# Patient Record
Sex: Male | Born: 1940
Health system: Southern US, Community
[De-identification: ages and names within clinical notes are randomized; demographics above are authoritative.]

## PROBLEM LIST (undated history)

## (undated) DIAGNOSIS — E039 Hypothyroidism, unspecified: Secondary | ICD-10-CM

## (undated) DIAGNOSIS — I10 Essential (primary) hypertension: Secondary | ICD-10-CM

## (undated) DIAGNOSIS — D649 Anemia, unspecified: Secondary | ICD-10-CM

## (undated) DIAGNOSIS — Z973 Presence of spectacles and contact lenses: Secondary | ICD-10-CM

## (undated) DIAGNOSIS — E785 Hyperlipidemia, unspecified: Secondary | ICD-10-CM

## (undated) DIAGNOSIS — Z87440 Personal history of urinary (tract) infections: Secondary | ICD-10-CM

## (undated) DIAGNOSIS — N343 Urethral syndrome, unspecified: Secondary | ICD-10-CM

## (undated) DIAGNOSIS — G4730 Sleep apnea, unspecified: Secondary | ICD-10-CM

## (undated) DIAGNOSIS — G4733 Obstructive sleep apnea (adult) (pediatric): Secondary | ICD-10-CM

## (undated) DIAGNOSIS — Z960 Presence of urogenital implants: Secondary | ICD-10-CM

## (undated) DIAGNOSIS — R079 Chest pain, unspecified: Secondary | ICD-10-CM

## (undated) DIAGNOSIS — R351 Nocturia: Secondary | ICD-10-CM

## (undated) DIAGNOSIS — L309 Dermatitis, unspecified: Secondary | ICD-10-CM

## (undated) DIAGNOSIS — E7800 Pure hypercholesterolemia, unspecified: Secondary | ICD-10-CM

## (undated) DIAGNOSIS — F419 Anxiety disorder, unspecified: Secondary | ICD-10-CM

## (undated) DIAGNOSIS — M1990 Unspecified osteoarthritis, unspecified site: Secondary | ICD-10-CM

## (undated) DIAGNOSIS — Z978 Presence of other specified devices: Secondary | ICD-10-CM

## (undated) DIAGNOSIS — K589 Irritable bowel syndrome without diarrhea: Secondary | ICD-10-CM

## (undated) DIAGNOSIS — H269 Unspecified cataract: Secondary | ICD-10-CM

## (undated) DIAGNOSIS — M7661 Achilles tendinitis, right leg: Secondary | ICD-10-CM

## (undated) DIAGNOSIS — N1330 Unspecified hydronephrosis: Secondary | ICD-10-CM

## (undated) HISTORY — DX: Unspecified cataract: H26.9

## (undated) HISTORY — PX: CATARACT EXTRACTION: SUR2

## (undated) HISTORY — DX: Hyperlipidemia, unspecified: E78.5

## (undated) HISTORY — PX: OTHER SURGICAL HISTORY: SHX169

## (undated) HISTORY — PX: WISDOM TOOTH EXTRACTION: SHX21

## (undated) HISTORY — PX: EYE SURGERY: SHX253

## (undated) HISTORY — DX: Obstructive sleep apnea (adult) (pediatric): G47.33

## (undated) HISTORY — PX: MASS EXCISION: SHX2000

## (undated) HISTORY — DX: Sleep apnea, unspecified: G47.30

## (undated) HISTORY — DX: Chest pain, unspecified: R07.9

## (undated) HISTORY — PX: TONSILLECTOMY: SUR1361

## (undated) HISTORY — DX: Essential (primary) hypertension: I10

## (undated) HISTORY — DX: Anxiety disorder, unspecified: F41.9

---
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
## 1997-10-19 ENCOUNTER — Ambulatory Visit (HOSPITAL_COMMUNITY): Admission: RE | Admit: 1997-10-19 | Discharge: 1997-10-19 | Payer: Self-pay | Admitting: Podiatry

## 2000-02-15 ENCOUNTER — Encounter: Payer: Self-pay | Admitting: Internal Medicine

## 2000-02-15 ENCOUNTER — Encounter: Admission: RE | Admit: 2000-02-15 | Discharge: 2000-02-15 | Payer: Self-pay | Admitting: Internal Medicine

## 2001-02-26 ENCOUNTER — Other Ambulatory Visit: Admission: RE | Admit: 2001-02-26 | Discharge: 2001-02-26 | Payer: Self-pay | Admitting: Gastroenterology

## 2001-02-26 ENCOUNTER — Encounter (INDEPENDENT_AMBULATORY_CARE_PROVIDER_SITE_OTHER): Payer: Self-pay | Admitting: Specialist

## 2004-10-12 ENCOUNTER — Ambulatory Visit (HOSPITAL_COMMUNITY): Admission: RE | Admit: 2004-10-12 | Discharge: 2004-10-12 | Payer: Self-pay | Admitting: *Deleted

## 2005-01-21 ENCOUNTER — Ambulatory Visit: Payer: Self-pay | Admitting: Gastroenterology

## 2005-02-07 ENCOUNTER — Ambulatory Visit: Payer: Self-pay | Admitting: Gastroenterology

## 2008-05-09 ENCOUNTER — Ambulatory Visit (HOSPITAL_COMMUNITY): Admission: RE | Admit: 2008-05-09 | Discharge: 2008-05-09 | Payer: Self-pay | Admitting: Ophthalmology

## 2008-08-08 DIAGNOSIS — G4733 Obstructive sleep apnea (adult) (pediatric): Secondary | ICD-10-CM

## 2008-08-08 HISTORY — DX: Obstructive sleep apnea (adult) (pediatric): G47.33

## 2010-01-03 ENCOUNTER — Encounter (INDEPENDENT_AMBULATORY_CARE_PROVIDER_SITE_OTHER): Payer: Self-pay | Admitting: *Deleted

## 2010-01-11 ENCOUNTER — Encounter: Payer: Self-pay | Admitting: Gastroenterology

## 2010-02-26 ENCOUNTER — Encounter (INDEPENDENT_AMBULATORY_CARE_PROVIDER_SITE_OTHER): Payer: Self-pay | Admitting: *Deleted

## 2010-02-28 ENCOUNTER — Ambulatory Visit: Payer: Self-pay | Admitting: Gastroenterology

## 2010-03-14 ENCOUNTER — Ambulatory Visit: Payer: Self-pay | Admitting: Gastroenterology

## 2010-07-03 NOTE — Miscellaneous (Signed)
Summary: [facility] Previsit/prep  Clinical Lists Changes  Medications: Added new medication of MOVIPREP 100 GM  SOLR (PEG-KCL-NACL-NASULF-NA ASC-C) As per prep instructions. - Signed Rx of MOVIPREP 100 GM  SOLR (PEG-KCL-NACL-NASULF-NA ASC-C) As per prep instructions.;  #1 x 0;  Signed;  Entered by: Wyona Almas RN;  Authorized by: Louis Meckel MD;  Method used: Electronically to [facility] [address], [place], [place]  [id number], Ph: [phone] or [phone], Fax: [phone] Observations: Added new observation of NKA: T (02/28/2010 13:48)    Prescriptions: MOVIPREP 100 GM  SOLR (PEG-KCL-NACL-NASULF-NA ASC-C) As per prep instructions.  #1 x 0   Entered by:   Wyona Almas RN   Authorized by:   Louis Meckel MD   Signed by:   Wyona Almas RN on 02/28/2010   Method used:   Electronically to        [facility] (retail)       [address]       [place], [place]  [id number]       Ph: [phone] or [phone]       Fax: [phone]   RxID:   [id number]

## 2010-07-03 NOTE — Letter (Signed)
Summary: [facility] Instructions  [facility]  [address] [place], [place] [address]   Phone: [phone]  Fax: [phone]       Randy Ford    1941/03/14    MRN: [id number]        Procedure Day Dorna Bloom: Wednesday 03-14-10     Arrival Time: 7:30 a.m.     Procedure Time: 8:30 a.m.     Location of Procedure:                    _x _  [facility] (4th Floor)   PREPARATION FOR COLONOSCOPY WITH MOVIPREP   Starting 5 days prior to your procedure  03-09-10  do not eat nuts, seeds, popcorn, corn, beans, peas,  salads, or any raw vegetables.  Do not take any fiber supplements (e.g. Metamucil, Citrucel, and Benefiber).  THE DAY BEFORE YOUR PROCEDURE         DATE:  03-13-10  DAY: Tuesday  1.  Drink clear liquids the entire day-NO SOLID FOOD  2.  Do not drink anything colored red or purple.  Avoid juices with pulp.  No orange juice.  3.  Drink at least 64 oz. (8 glasses) of fluid/clear liquids during the day to prevent dehydration and help the prep work efficiently.  CLEAR LIQUIDS INCLUDE: Water Jello Ice Popsicles Tea (sugar ok, no milk/cream) Powdered fruit flavored drinks Coffee (sugar ok, no milk/cream) Gatorade Juice: apple, white grape, white cranberry  Lemonade Clear bullion, consomm, broth Carbonated beverages (any kind) Strained chicken noodle soup Hard Candy                             4.  In the morning, mix first dose of MoviPrep solution:    Empty 1 Pouch A and 1 Pouch B into the disposable container    Add lukewarm drinking water to the top line of the container. Mix to dissolve    Refrigerate (mixed solution should be used within 24 hrs)  5.  Begin drinking the prep at 5:00 p.m. The MoviPrep container is divided by 4 marks.   Every 15 minutes drink the solution down to the next mark (approximately 8 oz) until the full liter is complete.   6.  Follow completed prep with 16 oz of clear liquid of your choice (Nothing red or  purple).  Continue to drink clear liquids until bedtime.  7.  Before going to bed, mix second dose of MoviPrep solution:    Empty 1 Pouch A and 1 Pouch B into the disposable container    Add lukewarm drinking water to the top line of the container. Mix to dissolve    Refrigerate  THE DAY OF YOUR PROCEDURE      DATE:  03-14-10  DAY:  Wednesday  Beginning at  3:30 a.m. (5 hours before procedure):         1. Every 15 minutes, drink the solution down to the next mark (approx 8 oz) until the full liter is complete.  2. Follow completed prep with 16 oz. of clear liquid of your choice.    3. You may drink clear liquids until  6:30 a.m. (2 HOURS BEFORE PROCEDURE).   MEDICATION INSTRUCTIONS  Unless otherwise instructed, you should take regular prescription medications with a small sip of water   as early as possible the morning of your procedure.    Additional medication instructions: Hold Iron for 5 days  prior to procedure.         OTHER INSTRUCTIONS  You will need a responsible adult at least 68 years of age to accompany you and drive you home.   This person must remain in the waiting room during your procedure.  Wear loose fitting clothing that is easily removed.  Leave jewelry and other valuables at home.  However, you may wish to bring a book to read or  an iPod/MP3 player to listen to music as you wait for your procedure to start.  Remove all body piercing jewelry and leave at home.  Total time from sign-in until discharge is approximately 2-3 hours.  You should go home directly after your procedure and rest.  You can resume normal activities the  day after your procedure.  The day of your procedure you should not:   Drive   Make legal decisions   Operate machinery   Drink alcohol   Return to work  You will receive specific instructions about eating, activities and medications before you leave.    The above instructions have been reviewed and explained  to me by   Wyona Almas RN  February 28, 2010 2:33 PM     I fully understand and can verbalize these instructions _____________________________ Date _________

## 2010-07-03 NOTE — Procedures (Signed)
Summary: Colonoscopy  Patient: Randy Ford Note: All result statuses are Final unless otherwise noted.  Tests: (1) Colonoscopy (COL)   COL Colonoscopy           DONE     [facility]     [address]     [place], [place]  [address]           COLONOSCOPY PROCEDURE REPORT           PATIENT:  Randy Ford, Randy Ford  MR#:  [id number]     BIRTHDATE:  Aug 05, 1940, 69 yrs. old  GENDER:  male           ENDOSCOPIST:  Barbette Hair. Arlyce Dice, MD     Referred by:           PROCEDURE DATE:  03/14/2010     PROCEDURE:  Diagnostic Colonoscopy     ASA CLASS:  Class II     INDICATIONS:  1) screening h/o hamartomatous polyp removed 2002;     colo 2006 normal           MEDICATIONS:   Fentanyl 50 mcg IV, Versed 6 mg IV           DESCRIPTION OF PROCEDURE:   After the risks benefits and     alternatives of the procedure were thoroughly explained, informed     consent was obtained.  Digital rectal exam was performed and     revealed no abnormalities.   The LB 180AL [id number] endoscope was     introduced through the anus and advanced to the cecum, which was     identified by the ileocecal valve, without limitations.  The     quality of the prep was excellent, using MiraLax.  The instrument     was then slowly withdrawn as the colon was fully examined.     <<PROCEDUREIMAGES>>           FINDINGS:  A normal appearing cecum, ileocecal valve, and     appendiceal orifice were identified. The ascending, hepatic     flexure, transverse, splenic flexure, descending, sigmoid colon,     and rectum appeared unremarkable (see image1, image2, image3,     image6, image7, image10, image13, image17, and image18).     Retroflexed views in the rectum revealed no abnormalities.    The     time to cecum =  3.0  minutes. The scope was then withdrawn (time     =  8.50  min) from the patient and the procedure completed.           COMPLICATIONS:  None           ENDOSCOPIC IMPRESSION:     1) Normal colon      RECOMMENDATIONS:     1) Colonoscopy 10 years           REPEAT EXAM:  In 10 year(s) for Colonoscopy.           ______________________________     Barbette Hair. Arlyce Dice, MD           CC: Renford Dills MD           n.     Rosalie DoctorBarbette Hair. Kaplan at 03/14/2010 09:13 AM           [name], [id number]  Note: An exclamation mark (!) indicates a result that was not dispersed into the flowsheet. Document Creation Date: 03/14/2010 9:14 AM _______________________________________________________________________  (1) Order result status: Final Collection or  observation date-time: 03/14/2010 09:07 Requested date-time:  Receipt date-time:  Reported date-time:  Referring Physician:   Ordering Physician: Melvia Heaps [phone]) Specimen Source:  Source: Launa Grill Order Number: [id number] Lab site:   Appended Document: Colonoscopy    Clinical Lists Changes  Observations: Added new observation of COLONNXTDUE: 03/2020 (03/14/2010 11:00)

## 2010-07-03 NOTE — Letter (Signed)
Summary: Previsit letter  [facility]  [address] [place], [place] [address]   Phone: [phone]  Fax: [phone]       01/11/2010 MRN: [id number]  [facility] [address] [place], [place]  [address]  Dear Randy Ford,  Welcome to the Gastroenterology Division at [facility].    You are scheduled to see a nurse for your pre-procedure visit on 02-28-10 at 2pm on the 3rd floor at [facility], [address]  We ask that you try to arrive at our office 15 minutes prior to your appointment time to allow for check-in.  Your nurse visit will consist of discussing your medical and surgical history, your immediate family medical history, and your medications.    Please bring a complete list of all your medications or, if you prefer, bring the medication bottles and we will list them.  We will need to be aware of both prescribed and over the counter drugs.  We will need to know exact dosage information as well.  If you are on blood thinners (Coumadin, Plavix, Aggrenox, Ticlid, etc.) please call our office today/prior to your appointment, as we need to consult with your physician about holding your medication.   Please be prepared to read and sign documents such as consent forms, a financial agreement, and acknowledgement forms.  If necessary, and with your consent, a friend or relative is welcome to sit-in on the nurse visit with you.  Please bring your insurance card so that we may make a copy of it.  If your insurance requires a referral to see a specialist, please bring your referral form from your primary care physician.  No co-pay is required for this nurse visit.     If you cannot keep your appointment, please call [phone] to cancel or reschedule prior to your appointment date.  This allows [place] the opportunity to schedule an appointment for another patient in need of care.    Thank you for choosing  Gastroenterology for your medical needs.  We  appreciate the opportunity to care for you.  Please visit [place] at our website  to learn more about our practice.                     Sincerely.                                                                                                                   The Gastroenterology Division

## 2010-07-03 NOTE — Letter (Signed)
Summary: Colonoscopy Letter  [facility]  [address] [place], [place] [address]   Phone: [phone]  Fax: [phone]      January 03, 2010 MRN: [id number]   [facility] [address] [place], [place]  [address]   Dear Mr. PERRELL,   According to your medical record, it is time for you to schedule a Colonoscopy. The American Cancer Society recommends this procedure as a method to detect early colon cancer. Patients with a family history of colon cancer, or a personal history of colon polyps or inflammatory bowel disease are at increased risk.  This letter has been generated based on the recommendations made at the time of your procedure. If you feel that in your particular situation this may no longer apply, please contact our office.  Please call our office at [phone] to schedule this appointment or to update your records at your earliest convenience.  Thank you for cooperating with [place] to provide you with the very best care possible.   Sincerely,   Barbette Hair. Arlyce Dice, M.D.  [facility]astroenterology Division [phone]

## 2010-10-16 NOTE — Op Note (Signed)
Randy Ford, EMBLETON                ACCOUNT NO.:  [id number]   MEDICAL RECORD NO.:  [id number]          PATIENT TYPE:  AMB   LOCATION:  SDS                          FACILITY:  [facility]   PHYSICIAN:  [name], M.D.   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   PROCEDURE:  1. Repair of the retinal detachment, via pars plana vitrectomy with      focal laser photocoagulation.  2. Injection of vitreous substitute dose - SF6 25% to the right eye -      25 gauge.   SURGEON:  [name]. Rankin, MD   ANESTHESIA:  Local retrobulbar with monitored anesthesia control.   INDICATIONS FOR PROCEDURE:  The patient is a 70 year old man, who has  less than 2 days onset of profound vision loss on the basis of a macular  hole retinal detachment.  The patient was attempted to reattach the  retina.  He understands the nature of the underlying disorder.  He  understands the risk of anesthesia, occurrence of death, loss of the eye  from the underlying condition as well as surgical repair including but  limited to hemorrhage, infection, scarring, need for another surgery,  change in vision, loss of vision, and progressive disease despite  intervention.  Appropriate signed consent was obtained, the patient was  taken to the operating room.  In the operating room, appropriate  monitors followed by mild sedation.  Xylocaine 2% 5 mL was injected  retrobulbar with additional 5 mL laterally in fashion of modified Darel Hong.  The right periocular region was sterilely prepped and draped in  the usual sterile fashion.  Lid speculum was applied.  A 25-gauge trocar  was placed in the inferotemporal quadrant.   The infusion was then turned  on.  Superior trocars were applied.  Core vitrectomy was then begun.  Posterior hyaloid was detached.  Peripheral vitreous was primarily  attached at the anterior to the equator.  The vitreous skirt was then  trimmed 360 degrees.  Small retinal hole was found, two of them side-by-  side spaced at the 10 o'clock and the 10:30 position.  Detachment  extended from the 7 o'clock position up to the 11:30 position temporally  and macula up.  Decision was made to make a small retinotomy posterior  to the equator to help drain the fluid.  Because of the shallow nature  of the fluid, it did not appear that external drainage would be  appropriate.  At this time, the fluid air exchange was then completed.  Fluid fluid exchange was completed first and the fluid air exchange  completed.  Retina reattached nicely.  Laser photocoagulation was placed  around the 2 retinal holes divided superiorly as well as around  retinotomy site as well as along the vitreous base  for the imbedded  attachment superiorly, temporally, and inferotemporally.  At this time,  reaccumulated fluid was aspirated on multiple occasions.  Laser  photocoagulation was confirmed to be sealing the retinotomy site as well  as retinal holes above.  A small amount of subretinal fluid was noted in  the macular region, but this was judged that with positioning would be  able to resolve.   At this time, instruments were removed from the eye; air-SF6 25%  exchange completed.  The trocars were removed superiorly.  The infusion  removed.  The intraocular pressure had been assessed and found to be  adequate.  Subconjunctival Decadron applied.  Sterile patch and Fox  shield applied.  The patient tolerated the procedure without  complication.      [name] Rankin, M.D.  Electronically Signed     GAR/MEDQ  D:  05/09/2008  T:  05/10/2008  Job:  [id number]

## 2010-12-24 ENCOUNTER — Ambulatory Visit (INDEPENDENT_AMBULATORY_CARE_PROVIDER_SITE_OTHER): Payer: Medicare Other | Admitting: Nurse Practitioner

## 2010-12-24 ENCOUNTER — Telehealth: Payer: Self-pay | Admitting: Gastroenterology

## 2010-12-24 ENCOUNTER — Other Ambulatory Visit (INDEPENDENT_AMBULATORY_CARE_PROVIDER_SITE_OTHER): Payer: Medicare Other

## 2010-12-24 ENCOUNTER — Encounter: Payer: Self-pay | Admitting: Nurse Practitioner

## 2010-12-24 DIAGNOSIS — K5900 Constipation, unspecified: Secondary | ICD-10-CM

## 2010-12-24 LAB — CBC WITH DIFFERENTIAL/PLATELET
Basophils Absolute: 0 10*3/uL (ref 0.0–0.1)
Eosinophils Absolute: 0.2 10*3/uL (ref 0.0–0.7)
Hemoglobin: 13.1 g/dL (ref 13.0–17.0)
Lymphocytes Relative: 26.5 % (ref 12.0–46.0)
Lymphs Abs: 2.2 10*3/uL (ref 0.7–4.0)
MCHC: 34.3 g/dL (ref 30.0–36.0)
Neutro Abs: 5.2 10*3/uL (ref 1.4–7.7)
Platelets: 276 10*3/uL (ref 150.0–400.0)
RDW: 13.9 % (ref 11.5–14.6)

## 2010-12-24 LAB — COMPREHENSIVE METABOLIC PANEL
ALT: 24 U/L (ref 0–53)
AST: 21 U/L (ref 0–37)
Chloride: 102 mEq/L (ref 96–112)
Creatinine, Ser: 1 mg/dL (ref 0.4–1.5)
Sodium: 139 mEq/L (ref 135–145)
Total Bilirubin: 0.5 mg/dL (ref 0.3–1.2)

## 2010-12-24 NOTE — Telephone Encounter (Signed)
Left message for pt to call back  °

## 2010-12-24 NOTE — Telephone Encounter (Signed)
Pt c/o severe constipation, reports that he has done several enemas. Pt scheduled to see Willette Cluster NP today at 2pm. Pt aware of appt date and time.

## 2010-12-24 NOTE — Patient Instructions (Signed)
Please go to the basement level to have your labs drawn.  We will call you with the results. Stop the iron supplements for 1 month. Take Miralax 17 grams in 8 oz of water or clear juice daily in the morning. We will fax the lab results and the office note from today to Dr. Nehemiah Settle. Please call us next week with an update on how you are doing regarding the constipation. You can ask for [facility], Dr. Marzetta Board nurse.

## 2010-12-26 ENCOUNTER — Encounter: Payer: Self-pay | Admitting: Nurse Practitioner

## 2010-12-27 ENCOUNTER — Encounter: Payer: Self-pay | Admitting: Nurse Practitioner

## 2010-12-27 DIAGNOSIS — K5900 Constipation, unspecified: Secondary | ICD-10-CM | POA: Insufficient documentation

## 2010-12-27 NOTE — Progress Notes (Signed)
12/27/2010 Randy Ford [id number] [birth year]   HISTORY OF PRESENT ILLNESS: Patient is a 70 year old white male last seen at time of his surveillance colonoscopy in Oct. 2011 done for history of hemartomatous polyps in 2002. Patient is here today for evaluation of constipation. Bowel movements always regular until two years ago when he began having very occasional constipation necessitating enema use every few months or so. Constipation became more frequent over the last few months and even more problematic over the last couple of weeks. Used three enemas last week without much benefit. No recent dietary, medication, or physical activity changes. No rectal bleeding.  He doesn't have abdominal pain, nausea or vomiting. He is on iron but has been so for a couple of years now. Patient gives a history of "mild anemia", probably related to blood donation every 8 weeks. For constipation he has tried various products suggested by "[facility]", sounds like some of those have been mixtures of flaxseed, prunes.  Has hypothyroidism but is on Synthroid.    History reviewed. No pertinent past medical history. Past Surgical History  Procedure Date  . Cataract extraction     x2  . Tonsillectomy 1948    reports that he has never smoked. He has never used smokeless tobacco. He reports that he drinks alcohol. He reports that he does not use illicit drugs. family history includes Cancer in his father and mother and Dementia in his mother. No Known Allergies    Outpatient Encounter Prescriptions as of 12/24/2010  Medication Sig Dispense Refill  . aspirin 650 MG tablet Take 650 mg by mouth every 6 (six) hours as needed.        . Cholecalciferol (VITAMIN D3) 1000 UNITS CAPS Take 1 capsule by mouth daily.        . ferrous sulfate 325 (65 FE) MG tablet Take 325 mg by mouth daily with breakfast.        . fish oil-omega-3 fatty acids 1000 MG capsule Take 2 g by mouth daily.        Marland Kitchen levothyroxine (SYNTHROID,  LEVOTHROID) 75 MCG tablet Take 75 mcg by mouth daily.        . simvastatin (ZOCOR) 20 MG tablet Take 20 mg by mouth at bedtime.           REVIEW OF SYSTEMS  : All other systems reviewed and negative except where noted in the History of Present Illness.   PHYSICAL EXAM: General: Well developed white male in no acute distress Head: Normocephalic and atraumatic Eyes:  sclerae anicteric,conjunctive pink. Ears: Normal auditory acuity Mouth: No deformity or lesions Neck: Supple, no masses.  Lungs: Clear throughout to auscultation Heart: Regular rate and rhythm; no murmurs heard Abdomen: Soft, non distended, nontender. No masses or hepatomegaly noted. Normal Bowel sounds Rectal: scant amount brown stool in vault. No masses felt. Musculoskeletal: Symmetrical with no gross deformities  Skin: No lesions on visible extremities Extremities: No edema or deformities noted Neurological: Alert oriented x 4, grossly nonfocal Cervical Nodes:  No significant cervical adenopathy Psychological:  Alert and cooperative. Normal mood and affect  ASSESSMENT AND PLAN;

## 2010-12-27 NOTE — Assessment & Plan Note (Signed)
Etiology not certain, could be the iron supplements but he has been on that for couple of years now. I have asked him to hold the iron for one month and for immediate relief, begin daily Miralax. If bowel movements normalize he can restart iron supplements in a month or so and see how things go. If iron seems to be the culprit patient may need to stay on Miralax for as long as he continues iron therapy. He will call me in several days with condition update.

## 2010-12-31 NOTE — Progress Notes (Signed)
I agree with assessment and plans 

## 2011-01-01 ENCOUNTER — Telehealth: Payer: Self-pay | Admitting: Gastroenterology

## 2011-01-01 NOTE — Telephone Encounter (Signed)
Pt called to let us know that Willette Cluster NP had him to hold his iron and has been using miralax. He just wanted to call to let us know this helped. He was taking full strength Miralax last week and has now cut back. Pt states he will discuss with his PCP as to when to resume his Iron tablets.

## 2011-01-21 ENCOUNTER — Telehealth: Payer: Self-pay | Admitting: *Deleted

## 2011-01-21 NOTE — Telephone Encounter (Signed)
Pt saw Gunnar Fusi in the office on 12-26-2010.

## 2011-03-08 LAB — BASIC METABOLIC PANEL
BUN: 11 mg/dL (ref 6–23)
Chloride: 103 mEq/L (ref 96–112)
Glucose, Bld: 94 mg/dL (ref 70–99)
Potassium: 3.9 mEq/L (ref 3.5–5.1)

## 2011-03-08 LAB — CBC
HCT: 44.2 % (ref 39.0–52.0)
MCHC: 33.9 g/dL (ref 30.0–36.0)
MCV: 92.9 fL (ref 78.0–100.0)
Platelets: 308 10*3/uL (ref 150–400)
RDW: 13.2 % (ref 11.5–15.5)

## 2012-07-08 ENCOUNTER — Encounter: Payer: Self-pay | Admitting: Pharmacist Clinician (PhC)/ Clinical Pharmacy Specialist

## 2012-11-19 ENCOUNTER — Encounter (INDEPENDENT_AMBULATORY_CARE_PROVIDER_SITE_OTHER): Payer: Medicare Other | Admitting: Ophthalmology

## 2012-11-19 DIAGNOSIS — H31419 Hemorrhagic choroidal detachment, unspecified eye: Secondary | ICD-10-CM

## 2012-11-19 DIAGNOSIS — H33009 Unspecified retinal detachment with retinal break, unspecified eye: Secondary | ICD-10-CM

## 2013-02-23 ENCOUNTER — Telehealth: Payer: Self-pay | Admitting: Gastroenterology

## 2013-02-23 NOTE — Telephone Encounter (Signed)
Pt states he called his PCP and is being seen today for diarrhea. Let pt know if he needs to be seen after seeing his PCP to call us back.

## 2013-03-05 ENCOUNTER — Ambulatory Visit (INDEPENDENT_AMBULATORY_CARE_PROVIDER_SITE_OTHER): Payer: Medicare Other | Admitting: Internal Medicine

## 2013-03-05 VITALS — BP 140/80 | HR 71 | Temp 98.2°F | Resp 16 | Ht 71.5 in | Wt 209.0 lb

## 2013-03-05 DIAGNOSIS — Z6828 Body mass index (BMI) 28.0-28.9, adult: Secondary | ICD-10-CM

## 2013-03-05 DIAGNOSIS — S91209A Unspecified open wound of unspecified toe(s) with damage to nail, initial encounter: Secondary | ICD-10-CM

## 2013-03-05 DIAGNOSIS — S91109A Unspecified open wound of unspecified toe(s) without damage to nail, initial encounter: Secondary | ICD-10-CM

## 2013-03-06 ENCOUNTER — Encounter: Payer: Self-pay | Admitting: Internal Medicine

## 2013-03-06 DIAGNOSIS — Z6828 Body mass index (BMI) 28.0-28.9, adult: Secondary | ICD-10-CM | POA: Insufficient documentation

## 2013-03-07 NOTE — Progress Notes (Signed)
R 2nd toenail coming off-loose 4-5 weeks after workout shoes hitting tip repetitively during walking No pain or redness  Exam 2nd toe longest Nail removed as it was hanging on a small piece of tissue New nail 40%emergence,healthy Nailbed intact  IMP 1)nail trauma w/ morton's toe  Disc advoid. techniques

## 2013-03-09 ENCOUNTER — Ambulatory Visit (INDEPENDENT_AMBULATORY_CARE_PROVIDER_SITE_OTHER): Payer: Medicare Other | Admitting: Family Medicine

## 2013-03-09 ENCOUNTER — Ambulatory Visit: Payer: Medicare Other

## 2013-03-09 DIAGNOSIS — M79609 Pain in unspecified limb: Secondary | ICD-10-CM

## 2013-03-09 DIAGNOSIS — S93609A Unspecified sprain of unspecified foot, initial encounter: Secondary | ICD-10-CM

## 2013-03-09 NOTE — Progress Notes (Addendum)
Subjective:    Patient ID: Randy Ford, male    DOB: 22-Mar-1941, 72 y.o.   MRN: [id number] HPI Review of Systems Objective:   Physical Exam Assessment & Plan:    @UMFCLOGO @  Patient ID: Randy Ford MRN: [id number], DOB: [birth year], 72 y.o. Date of Encounter: 03/09/2013, 8:57 AM  Primary Physician: Katy Apo, MD  Chief Complaint: Left middle finger injury  HPI: 72 y.o. year old male with history below presents with a left middle finger injury onset earlier today when he was driving and his glasses started to fall off, so he reached up quickly to save them, but jammed his left middle finger into the driver's side door frame of his car. He reports pain, swelling and thinks he may have sprained his finger. He reports being left handed. 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 He also worked at [employer], Diplomatic Services operational officer movie reviews. He reports that now he is catching up on all the books he has missed reading.    Past Medical History  Diagnosis Date  . Hypertension   . Hyperlipidemia   . OSA (obstructive sleep apnea) 08/08/2008    AHI 1.09/hr     Home Meds: Prior to Admission medications   Medication Sig Start Date End Date Taking? Authorizing Provider  Cholecalciferol (VITAMIN D3) 1000 UNITS CAPS Take 1 capsule by mouth daily.     Yes Historical Provider, MD  fish oil-omega-3 fatty acids 1000 MG capsule Take 2 g by mouth daily.     Yes Historical Provider, MD  levothyroxine (SYNTHROID, LEVOTHROID) 75 MCG tablet Take 75 mcg by mouth daily.     Yes Historical Provider, MD  simvastatin (ZOCOR) 20 MG tablet Take 20 mg by mouth at bedtime.     Yes Historical Provider, MD  aspirin 650 MG tablet Take 650 mg by mouth every 6 (six) hours as needed.      Historical Provider, MD  aspirin EC 81 MG tablet Take 81 mg by mouth daily.    Historical Provider, MD  ferrous sulfate 325 (65 FE) MG tablet Take 325 mg by mouth daily with breakfast.       Historical Provider, MD  Magnesium 250 MG TABS Take 250 mg by mouth daily.    Historical Provider, MD    Allergies: No Known Allergies  History   Social History  . Marital Status: Married    Spouse Name: N/A    Number of Children: N/A  . Years of Education: N/A   Occupational History  . Retired     Runner, broadcasting/film/video   Social History Main Topics  . Smoking status: Never Smoker   . Smokeless tobacco: Never Used  . Alcohol Use: Yes     Comment: 1/2 beer every 2 weeks  . Drug Use: No  . Sexual Activity: Not on file   Other Topics Concern  . Not on file   Social History Narrative  . No narrative on file     Review of Systems: Constitutional: negative for chills, fever, night sweats, weight changes, or fatigue  HEENT: negative for vision changes, hearing loss, congestion, rhinorrhea, ST, epistaxis, or sinus pressure Cardiovascular: negative for chest pain or palpitations Respiratory: negative for hemoptysis, wheezing, shortness of breath, or cough Abdominal: negative for abdominal pain, nausea, vomiting, diarrhea, or constipation Dermatological: negative for rash Neurologic: negative for headache, dizziness, or syncope Musc: positive for left middle finger injury, with pain and swelling. All other systems reviewed and are  otherwise negative with the exception to those above and in the HPI.   Physical Exam: Blood pressure 142/80, pulse 74, temperature 98.6 F (37 C), temperature source Oral, resp. rate 18, height 5' 10.25" (1.784 m), weight 207 lb 12.8 oz (94.257 kg), SpO2 97.00%., Body mass index is 29.62 kg/(m^2). General: Well developed, well nourished, in no acute distress. Head: Normocephalic, atraumatic, eyes without discharge, sclera non-icteric, nares are without discharge. Bilateral auditory canals clear, TM's are without perforation, pearly grey and translucent with reflective cone of light bilaterally. Oral cavity moist, posterior pharynx without exudate, erythema,  peritonsillar abscess, or post nasal drip.  Neck: Supple. No thyromegaly. Full ROM. No lymphadenopathy. Lungs: Clear bilaterally to auscultation without wheezes, rales, or rhonchi. Breathing is unlabored. Heart: RRR with S1 S2. No murmurs, rubs, or gallops appreciated. Abdomen: Soft, non-tender, non-distended with normoactive bowel sounds. No hepatomegaly. No rebound/guarding. No obvious abdominal masses. Msk:  Strength and tone normal for age. Extremities/Skin: Warm and dry. No clubbing or cyanosis. No edema. No rashes or suspicious lesions. Left middle finger is swllen and mildly tender along the proximal phalanx and PIP joints with reasonable ROM Neuro: Alert and oriented X 3. Moves all extremities spontaneously. Gait is normal. CNII-XII grossly in tact. Psych:  Responds to questions appropriately with a normal affect.   Labs: [facility] reading (PRIMARY) by  Dr. Milus Glazier:  Left middle finger. negative    ASSESSMENT AND PLAN:  72 y.o. year old male with sprained left middle finger Buddy tape x 10 days   Signed, Elvina Sidle, MD 03/09/2013 8:57 AM

## 2013-03-10 ENCOUNTER — Ambulatory Visit: Payer: Medicare Other | Admitting: Podiatry

## 2013-05-24 ENCOUNTER — Ambulatory Visit (INDEPENDENT_AMBULATORY_CARE_PROVIDER_SITE_OTHER): Payer: Medicare Other | Admitting: Family Medicine

## 2013-05-24 VITALS — BP 122/72 | HR 69 | Temp 98.4°F | Resp 17 | Ht 71.5 in | Wt 213.8 lb

## 2013-05-24 DIAGNOSIS — J019 Acute sinusitis, unspecified: Secondary | ICD-10-CM

## 2013-05-24 DIAGNOSIS — R05 Cough: Secondary | ICD-10-CM

## 2013-05-24 MED ORDER — AMOXICILLIN 500 MG PO CAPS: 1000.0000 mg | ORAL_CAPSULE | Freq: Two times a day (BID) | ORAL | Status: DC

## 2013-05-24 MED ORDER — ACETAMINOPHEN-CODEINE #3 300-30 MG PO TABS: 1.0000 | ORAL_TABLET | Freq: Four times a day (QID) | ORAL | Status: DC | PRN

## 2013-05-24 NOTE — Patient Instructions (Signed)
Use the amoxicillin as directed- if you are not feeling better please let me know.  Use the tylenol #3 as needed for cough - remember it can make you drowsy so do not use it when you need to drive.

## 2013-05-24 NOTE — Progress Notes (Signed)
[facility] [address], [place] [address] [phone]  Date:  05/24/2013   Name:  Abrahm Mancia   DOB:  [birth year]   MRN:  [id number]  PCP:  Katy Apo, MD    Chief Complaint: Nasal Congestion, Headache, Abdominal Pain and Cough   History of Present Illness:  Randy Ford is a 72 y.o. very pleasant male patient who presents with the following:  He has been ill for about one week with HA, "sour stomach," nasal congestion.  Thought that he had a cold.  When he lies down at night he will cough a lot and cannot sleep.  He has noted a lot of mucus from his nose- he does not think he is coughing up material from his lungs but rather from his nose.    He does not have any abdominal pain- just feels "sour" and takes an antacid as needed   They have not noted any fever.  He has not had body aches or chills.    No vomiting or diarrhea.  If anything he has been a little constipated.   He has noted a mild ST, but he feels this is likely due to cough.   No sick contacts that he is aware of.    He has not felt "wiped out," does not think that he has the flu.    He took 2 tylenol this am at 0600.    He is generally healthy except for HTN, obesity, hyperlipidemia and OSA for which he uses cpap   Patient Active Problem List   Diagnosis Date Noted  . BMI 28.0-28.9,adult 03/06/2013  . Constipation 12/27/2010    Past Medical History  Diagnosis Date  . Hypertension   . Hyperlipidemia   . OSA (obstructive sleep apnea) 08/08/2008    AHI 1.09/hr    Past Surgical History  Procedure Laterality Date  . Cataract extraction      x2  . Tonsillectomy  1948    History  Substance Use Topics  . Smoking status: Never Smoker   . Smokeless tobacco: Never Used  . Alcohol Use: Yes     Comment: 1/2 beer every 2 weeks    Family History  Problem Relation Age of Onset  . Cancer Mother     Bladder  . Dementia Mother   . Cancer Father     spinal    No Known  Allergies  Medication list has been reviewed and updated.  Current Outpatient Prescriptions on File Prior to Visit  Medication Sig Dispense Refill  . aspirin 650 MG tablet Take 650 mg by mouth every 6 (six) hours as needed.        Marland Kitchen aspirin EC 81 MG tablet Take 81 mg by mouth daily.      . Cholecalciferol (VITAMIN D3) 1000 UNITS CAPS Take 1 capsule by mouth daily.        . ferrous sulfate 325 (65 FE) MG tablet Take 325 mg by mouth daily with breakfast.        . fish oil-omega-3 fatty acids 1000 MG capsule Take 2 g by mouth daily.        Marland Kitchen levothyroxine (SYNTHROID, LEVOTHROID) 75 MCG tablet Take 75 mcg by mouth daily.        . Magnesium 250 MG TABS Take 250 mg by mouth daily.      . simvastatin (ZOCOR) 20 MG tablet Take 20 mg by mouth at bedtime.  No current facility-administered medications on file prior to visit.    Review of Systems:  As per HPI- otherwise negative.   Physical Examination: Filed Vitals:   05/24/13 1018  BP: 122/72  Pulse: 69  Temp: 98.4 F (36.9 C)  Resp: 17   Filed Vitals:   05/24/13 1018  Height: 5' 11.5" (1.816 m)  Weight: 213 lb 12.8 oz (96.979 kg)   Body mass index is 29.41 kg/(m^2). Ideal Body Weight: Weight in (lb) to have BMI = 25: 181.4  GEN: WDWN, NAD, Non-toxic, A & O x 3, obese, looks well HEENT: Atraumatic, Normocephalic. Neck supple. No masses, No LAD.  Bilateral TM wnl, oropharynx normal.  PEERL,EOMI.  Nasal cavity is congested.   Ears and Nose: No external deformity. CV: RRR, No M/G/R. No JVD. No thrill. No extra heart sounds. PULM: CTA B, no wheezes, crackles, rhonchi. No retractions. No resp. distress. No accessory muscle use. ABD: S, NT, ND, +BS. No rebound. No HSM. EXTR: No c/c/e NEURO Normal gait.  PSYCH: Normally interactive. Conversant. Not depressed or anxious appearing.  Calm demeanor.    Assessment and Plan: Sinusitis, acute - Plan: amoxicillin (AMOXIL) 500 MG capsule  Cough - Plan: acetaminophen-codeine  (TYLENOL #3) 300-30 MG per tablet  amoxicillin for sinusitis, tylenol #3 to use prn for cough and discomfort (avoid hycodan as he endorses a significant history of BPH).   See patient instructions for more details.     Signed Abbe Amsterdam, MD

## 2013-09-07 ENCOUNTER — Ambulatory Visit: Payer: Medicare Other | Admitting: Podiatry

## 2014-01-10 ENCOUNTER — Encounter: Payer: Self-pay | Admitting: Gastroenterology

## 2014-01-27 ENCOUNTER — Telehealth: Payer: Self-pay | Admitting: Gastroenterology

## 2014-01-28 NOTE — Telephone Encounter (Signed)
Left message to call.

## 2014-01-31 NOTE — Telephone Encounter (Signed)
I have left message for the patient to call back 

## 2014-07-22 ENCOUNTER — Telehealth: Payer: Self-pay | Admitting: Cardiovascular Disease

## 2014-07-22 NOTE — Telephone Encounter (Signed)
Received medical records and referral on 07/22/2014 from [facility].  Records given to [facility].

## 2014-08-01 ENCOUNTER — Encounter: Payer: Self-pay | Admitting: Cardiovascular Disease

## 2014-08-01 ENCOUNTER — Ambulatory Visit (INDEPENDENT_AMBULATORY_CARE_PROVIDER_SITE_OTHER): Payer: Medicare Other | Admitting: Cardiovascular Disease

## 2014-08-01 VITALS — BP 154/70 | HR 76 | Ht 70.0 in | Wt 217.5 lb

## 2014-08-01 DIAGNOSIS — E785 Hyperlipidemia, unspecified: Secondary | ICD-10-CM | POA: Insufficient documentation

## 2014-08-01 DIAGNOSIS — R002 Palpitations: Secondary | ICD-10-CM | POA: Diagnosis not present

## 2014-08-01 DIAGNOSIS — G4733 Obstructive sleep apnea (adult) (pediatric): Secondary | ICD-10-CM | POA: Insufficient documentation

## 2014-08-01 DIAGNOSIS — R079 Chest pain, unspecified: Secondary | ICD-10-CM | POA: Insufficient documentation

## 2014-08-01 DIAGNOSIS — I10 Essential (primary) hypertension: Secondary | ICD-10-CM | POA: Insufficient documentation

## 2014-08-01 DIAGNOSIS — I209 Angina pectoris, unspecified: Secondary | ICD-10-CM

## 2014-08-01 NOTE — Progress Notes (Signed)
08/01/2014 Randy Ford   06/21/1940  [id number]  Primary Physician Kandice Hams, MD Primary Cardiologist: Lorretta Harp MD Renae Gloss   HPI:  Randy Ford is a 74 year old mildly overweight married Caucasian male who is accompanied by his wife today. He has no children. I last saw him in the office 02/12/12. He has a history of hypertension, hyperlipidemia and obstructive sleep apnea on C Pap. He had a Myoview stress test performed on 06/24/2008 which was negative. His lipid profile followed by his primary care physician Dr. Delfina Redwood during he took an excessive amount of oral iron pills one month ago in attempt to increase his hemoglobin prior to voluntary blood donation after which he developed substernal chest pain which has been constant. His primary care physician thought this could either be "ischemia, reflux, or musculoskeletal.   Current Outpatient Prescriptions  Medication Sig Dispense Refill  . esomeprazole (NEXIUM) 40 MG packet Take 40 mg by mouth daily before breakfast. 40 mg for 2 weeks and then 20 mg and weaning off    . fish oil-omega-3 fatty acids 1000 MG capsule Take 2 g by mouth daily.      . folic acid (FOLVITE) 258 MCG tablet Take 400 mcg by mouth daily.    Marland Kitchen levothyroxine (SYNTHROID, LEVOTHROID) 75 MCG tablet Take 75 mcg by mouth daily.      . simvastatin (ZOCOR) 20 MG tablet Take 20 mg by mouth at bedtime.      . Vitamin D, Cholecalciferol, 1000 UNITS TABS Take 1 tablet by mouth daily.     No current facility-administered medications for this visit.    No Known Allergies  History   Social History  . Marital Status: Married    Spouse Name: N/A  . Number of Children: N/A  . Years of Education: N/A   Occupational History  . Retired     Pharmacist, hospital   Social History Main Topics  . Smoking status: Never Smoker   . Smokeless tobacco: Never Used  . Alcohol Use: Yes     Comment: 1/2 beer every 2 weeks  . Drug Use: No  . Sexual Activity: Not on file    Other Topics Concern  . Not on file   Social History Narrative     Review of Systems: General: negative for chills, fever, night sweats or weight changes.  Cardiovascular: negative for chest pain, dyspnea on exertion, edema, orthopnea, palpitations, paroxysmal nocturnal dyspnea or shortness of breath Dermatological: negative for rash Respiratory: negative for cough or wheezing Urologic: negative for hematuria Abdominal: negative for nausea, vomiting, diarrhea, bright red blood per rectum, melena, or hematemesis Neurologic: negative for visual changes, syncope, or dizziness All other systems reviewed and are otherwise negative except as noted above.    Blood pressure 154/70, pulse 76, height 5\' 10"  (1.778 m), weight 217 lb 8 oz (98.657 kg).  General appearance: alert and no distress Neck: no adenopathy, no carotid bruit, no JVD, supple, symmetrical, trachea midline and thyroid not enlarged, symmetric, no tenderness/mass/nodules Lungs: clear to auscultation bilaterally Heart: regular rate and rhythm, S1, S2 normal, no murmur, click, rub or gallop Extremities: extremities normal, atraumatic, no cyanosis or edema  EKG normal sinus rhythm at 76 without ST or T-wave changes. I personally reviewed this EKG  ASSESSMENT AND PLAN:   Palpitations Patient complains of palpitations that occur on a daily basis. We will obtain a 2 week event monitor   Chest pain Randy Ford complains of chest pain that began approximately 4  weeks ago after taking an excess amount of oral iron. The pain lasts all day. It sounds more like reflux and is slowly getting better with the addition of antireflux medications. Given his risk factors though I'm going to obtain a exercise Myoview stress test to compare to the one performed in 2010 to rule out an ischemic etiology.   Essential hypertension History of hypertension with blood pressure measured at 154/70. He is not on any antihypertensive  medications.   Hyperlipidemia History of hyperlipidemia on simvastatin 20 mg a day followed by his PCP   Obstructive sleep apnea History of obstructive sleep apnea on C Pap which he benefits from       Lorretta Harp MD [facility], [facility] 08/01/2014 4:09 PM

## 2014-08-01 NOTE — Patient Instructions (Signed)
Please follow up with Dr. Gwenlyn Found in 1 month.   Your physician has requested that you have an exercise stress myoview. For further information please visit [email or web address]. Please follow instruction sheet, as given.  Your Doctor has ordered you to wear a heart monitor. You will wear this for 14 days.   TIPS -  REMINDERS 1. The sensor is the lanyard that is worn around your neck every day - this is powered by a battery that needs to be changed every day 2. The monitor is the device that allows you to record symptoms - this will need to be charged daily 3. The sensor & monitor need to be within 100 feet of each other at all times 4. The sensor connects to the electrodes (stickers) - these should be changed every 24-48 hours (you do not have to remove them when you bathe, just make sure they are dry when you connect it back to the sensor 5. If you need more supplies (electrodes, batteries), please call the 1-800 # on the back of the pamphlet and [employer] will mail you more supplies 6. If your skin becomes sensitive, please try the sample pack of sensitive skin electrodes (the white packet in your silver box) and call [employer] to have them mail you more of these type of electrodes 7. When you are finish wearing the monitor, please place all supplies back in the silver box, place the silver box in the pre-packaged UPS bag and drop off at [employer] or call them so they can come pick it up   Cardiac Event Monitoring A cardiac event monitor is a small recording device used to help detect abnormal heart rhythms (arrhythmias). The monitor is used to record heart rhythm when noticeable symptoms such as the following occur:  Fast heartbeats (palpitations), such as heart racing or fluttering.  Dizziness.  Fainting or light-headedness.  Unexplained weakness. The monitor is wired to two electrodes placed on your chest. Electrodes are flat, sticky disks that attach to your skin. The monitor can be worn for  up to 30 days. You will wear the monitor at all times, except when bathing.  HOW TO USE YOUR CARDIAC EVENT MONITOR A technician will prepare your chest for the electrode placement. The technician will show you how to place the electrodes, how to work the monitor, and how to replace the batteries. Take time to practice using the monitor before you leave the office. Make sure you understand how to send the information from the monitor to your health care provider. This requires a telephone with a landline, not a cell phone. You need to:  Wear your monitor at all times, except when you are in water:  Do not get the monitor wet.  Take the monitor off when bathing. Do not swim or use a hot tub with it on.  Keep your skin clean. Do not put body lotion or moisturizer on your chest.  Change the electrodes daily or any time they stop sticking to your skin. You might need to use tape to keep them on.  It is possible that your skin under the electrodes could become irritated. To keep this from happening, try to put the electrodes in slightly different places on your chest. However, they must remain in the area under your left breast and in the upper right section of your chest.  Make sure the monitor is safely clipped to your clothing or in a location close to your body that your health care provider recommends.  Press the button to record when you feel symptoms of heart trouble, such as dizziness, weakness, light-headedness, palpitations, thumping, shortness of breath, unexplained weakness, or a fluttering or racing heart. The monitor is always on and records what happened slightly before you pressed the button, so do not worry about being too late to get good information.  Keep a diary of your activities, such as walking, doing chores, and taking medicine. It is especially important to note what you were doing when you pushed the button to record your symptoms. This will help your health care provider  determine what might be contributing to your symptoms. The information stored in your monitor will be reviewed by your health care provider alongside your diary entries.  Send the recorded information as recommended by your health care provider. It is important to understand that it will take some time for your health care provider to process the results.  Change the batteries as recommended by your health care provider. SEEK IMMEDIATE MEDICAL CARE IF:   You have chest pain.  You have extreme difficulty breathing or shortness of breath.  You develop a very fast heartbeat that persists.  You develop dizziness that does not go away.  You faint or constantly feel you are about to faint. Document Released: 02/27/2008 Document Revised: 10/04/2013 Document Reviewed: 11/16/2012 [facility] Patient Information 2015 [place], [place]. This information is not intended to replace advice given to you by your health care provider. Make sure you discuss any questions you have with your health care provider.

## 2014-08-01 NOTE — Assessment & Plan Note (Signed)
History of hyperlipidemia on simvastatin 20 mg a day followed by his PCP 

## 2014-08-01 NOTE — Assessment & Plan Note (Signed)
Patient complains of palpitations that occur on a daily basis. We will obtain a 2 week event monitor

## 2014-08-01 NOTE — Assessment & Plan Note (Signed)
History of obstructive sleep apnea on C Pap which he benefits from

## 2014-08-01 NOTE — Assessment & Plan Note (Signed)
Mr. Randy Ford complains of chest pain that began approximately 4 weeks ago after taking an excess amount of oral iron. The pain lasts all day. It sounds more like reflux and is slowly getting better with the addition of antireflux medications. Given his risk factors though I'm going to obtain a exercise Myoview stress test to compare to the one performed in 2010 to rule out an ischemic etiology.

## 2014-08-01 NOTE — Assessment & Plan Note (Signed)
History of hypertension with blood pressure measured at 154/70. He is not on any antihypertensive medications.

## 2014-08-11 ENCOUNTER — Telehealth (HOSPITAL_COMMUNITY): Payer: Self-pay

## 2014-08-11 NOTE — Telephone Encounter (Signed)
Encounter complete. 

## 2014-08-16 ENCOUNTER — Ambulatory Visit (HOSPITAL_COMMUNITY)
Admission: RE | Admit: 2014-08-16 | Discharge: 2014-08-16 | Disposition: A | Discharge status: Home / Self Care | Payer: Medicare Other | Source: Ambulatory Visit | Attending: Cardiology | Admitting: Cardiology

## 2014-08-16 DIAGNOSIS — R002 Palpitations: Secondary | ICD-10-CM | POA: Insufficient documentation

## 2014-08-16 DIAGNOSIS — E669 Obesity, unspecified: Secondary | ICD-10-CM | POA: Diagnosis not present

## 2014-08-16 DIAGNOSIS — R079 Chest pain, unspecified: Secondary | ICD-10-CM | POA: Diagnosis present

## 2014-08-16 DIAGNOSIS — E785 Hyperlipidemia, unspecified: Secondary | ICD-10-CM | POA: Insufficient documentation

## 2014-08-16 DIAGNOSIS — I10 Essential (primary) hypertension: Secondary | ICD-10-CM | POA: Insufficient documentation

## 2014-08-16 DIAGNOSIS — Z8249 Family history of ischemic heart disease and other diseases of the circulatory system: Secondary | ICD-10-CM | POA: Insufficient documentation

## 2014-08-16 MED ORDER — TECHNETIUM TC 99M SESTAMIBI GENERIC - CARDIOLITE
29.0000 | Freq: Once | INTRAVENOUS | Status: AC | PRN
  Administered 2014-08-16: 29 via INTRAVENOUS

## 2014-08-16 MED ORDER — TECHNETIUM TC 99M SESTAMIBI GENERIC - CARDIOLITE
10.7000 | Freq: Once | INTRAVENOUS | Status: AC | PRN
  Administered 2014-08-16: 11 via INTRAVENOUS

## 2014-08-16 NOTE — Procedures (Addendum)
[facility] [address] [place] [address] [phone]  Cardiology Nuclear Med Study  Randy Ford is a 74 y.o. male     MRN : [id number]     DOB: Jul 01, 1940  Procedure Date: 08/16/2014  Nuclear Med Background Indication for Stress Test:  Evaluation for Ischemia History:  No prior cardiac or respiratory history repeorted;No prior NUC MPI for comparison. Cardiac Risk Factors: Family History - CAD, Hypertension, Lipids and Obesity  Symptoms:  Chest Pain and Palpitations   Nuclear Pre-Procedure Caffeine/Decaff Intake:  9:00pm NPO After: 5:00am   IV Site: R Forearm  IV 0.9% NS with Angio Cath:  22g  Chest Size (in):  48" IV Started by: Rolene Course, RN  Height: 5\' 10"  (1.778 m)  Cup Size: n/a  BMI:  Body mass index is 31.14 kg/(m^2). Weight:  217 lb (98.431 kg)   Tech Comments:  n/a    Nuclear Med Study 1 or 2 day study: 1 day  Stress Test Type:  Stress  Order Authorizing Provider:  Quay Burow, Md   Resting Radionuclide: Technetium 49m Sestamibi  Resting Radionuclide Dose: 10.7 mCi   Stress Radionuclide:  Technetium 68m Sestamibi  Stress Radionuclide Dose: 29.0 mCi           Stress Protocol Rest HR: 57 Stress HR: 126  Rest BP: 183/84 Stress BP: 198/92  Exercise Time (min): 11:16 METS: 10.70   Predicted Max HR: 148 bpm % Max HR: 85.14 bpm Rate Pressure Product: 01007  Dose of Adenosine (mg):  n/a Dose of Lexiscan: n/a mg  Dose of Atropine (mg): n/a Dose of Dobutamine: n/a mcg/kg/min (at max HR)  Stress Test Technologist: Mellody Memos, CCT Nuclear Technologist: Anthony Sar   Rest Procedure:  Myocardial perfusion imaging was performed at rest 45 minutes following the intravenous administration of Technetium 59m Sestamibi. Stress Procedure:  The patient performed treadmill exercise using a Bruce  Protocol for 11 minutes 16 seconds. The patient stopped due to generalized fatigue. Patient denied any  chest pain.  There were no significant ST-T wave changes.  Technetium 81m Sestamibi was injected IV at peak exercise and myocardial perfusion imaging was performed after a brief delay.  Transient Ischemic Dilatation (Normal <1.22):  1.05 QGS EDV:  115 ml QGS ESV:  53 ml LV Ejection Fraction: 54%    Rest ECG: NSR - Normal EKG  Stress ECG: Insignificant upsloping ST segment depression.  QPS Raw Data Images:  Acquisition technically good; normal left ventricular size. Stress Images:  Normal homogeneous uptake in all areas of the myocardium. Rest Images:  Normal homogeneous uptake in all areas of the myocardium. Subtraction (SDS):  No evidence of ischemia.  Impression Exercise Capacity:  Good exercise capacity. BP Response:  There is question of hypotensive response with peak exercise (SBP decreased from 159 to 118); however, immediate post exercise SBP 194 and therefore doubt 118 was accurate. Clinical Symptoms:  There is dyspnea. ECG Impression:  Insignificant upsloping ST segment depression. Comparison with Prior Nuclear Study: No previous nuclear study performed  Overall Impression:  Normal stress nuclear study.  LV Wall Motion:  NL LV Function; NL Wall Motion   Kirk Ruths, MD  08/16/2014 1:08 PM

## 2014-08-17 ENCOUNTER — Encounter: Payer: Self-pay | Admitting: *Deleted

## 2014-08-24 ENCOUNTER — Ambulatory Visit: Payer: Medicare Other | Admitting: Cardiovascular Disease

## 2014-08-29 ENCOUNTER — Ambulatory Visit (INDEPENDENT_AMBULATORY_CARE_PROVIDER_SITE_OTHER): Payer: Medicare Other | Admitting: Cardiovascular Disease

## 2014-08-29 ENCOUNTER — Encounter: Payer: Self-pay | Admitting: Cardiovascular Disease

## 2014-08-29 VITALS — BP 124/60 | HR 64 | Ht 70.0 in | Wt 214.3 lb

## 2014-08-29 DIAGNOSIS — R002 Palpitations: Secondary | ICD-10-CM | POA: Diagnosis not present

## 2014-08-29 DIAGNOSIS — R079 Chest pain, unspecified: Secondary | ICD-10-CM

## 2014-08-29 NOTE — Progress Notes (Signed)
Mr. Neyhart returns today for follow-up of his Myoview stress test performed 08/16/14 and 2 week event monitor all of which were normal. He has had no recurrent chest pain. I've reassured him and will see back in 1 year follow-up.  Lorretta Harp, M.D., [place], [facility], [place] [address] [place], [place]  [id number]  [phone] 08/29/2014 3:22 PM

## 2014-08-29 NOTE — Assessment & Plan Note (Signed)
History of palpitations with recent two-week event monitor that showed normal sinus rhythm without ectopy.

## 2014-08-29 NOTE — Patient Instructions (Signed)
Your physician wants you to follow-up in: 1 year with Dr Berry. You will receive a reminder letter in the mail two months in advance. If you don't receive a letter, please call our office to schedule the follow-up appointment.  

## 2014-08-29 NOTE — Assessment & Plan Note (Signed)
History of chest pain probably explainable from eating excessive iron pills with a negative Myoview stress test performed 08/16/14. He has had no recurrent chest pain.

## 2014-09-18 ENCOUNTER — Encounter: Payer: Self-pay | Admitting: Cardiovascular Disease

## 2014-09-19 ENCOUNTER — Encounter: Payer: Self-pay | Admitting: *Deleted

## 2014-11-02 ENCOUNTER — Other Ambulatory Visit: Payer: Self-pay | Admitting: Orthopedic Surgery

## 2014-11-28 ENCOUNTER — Other Ambulatory Visit: Payer: Self-pay

## 2015-08-23 ENCOUNTER — Ambulatory Visit (INDEPENDENT_AMBULATORY_CARE_PROVIDER_SITE_OTHER): Payer: Medicare Other | Admitting: Cardiovascular Disease

## 2015-08-23 ENCOUNTER — Encounter: Payer: Self-pay | Admitting: Cardiovascular Disease

## 2015-08-23 VITALS — BP 146/72 | HR 82 | Ht 71.0 in | Wt 203.0 lb

## 2015-08-23 DIAGNOSIS — E785 Hyperlipidemia, unspecified: Secondary | ICD-10-CM | POA: Diagnosis not present

## 2015-08-23 DIAGNOSIS — R002 Palpitations: Secondary | ICD-10-CM

## 2015-08-23 DIAGNOSIS — I10 Essential (primary) hypertension: Secondary | ICD-10-CM

## 2015-08-23 DIAGNOSIS — R079 Chest pain, unspecified: Secondary | ICD-10-CM | POA: Diagnosis not present

## 2015-08-23 NOTE — Assessment & Plan Note (Signed)
History of hyperlipidemia. On statin therapy followed by his PCP 

## 2015-08-23 NOTE — Patient Instructions (Signed)
Medication Instructions:  Your physician recommends that you continue on your current medications as directed. Please refer to the Current Medication list given to you today.   Labwork: none  Testing/Procedures: None   Follow-Up: Your physician wants you to follow-up in: 12 months with Dr. Gwenlyn Found. You will receive a reminder letter in the mail two months in advance. If you don't receive a letter, please call our office to schedule the follow-up appointment.   Any Other Special Instructions Will Be Listed Below (If Applicable).     If you need a refill on your cardiac medications before your next appointment, please call your pharmacy.

## 2015-08-23 NOTE — Assessment & Plan Note (Signed)
History of hypertensive blood pressure measured at 146/72. He is on losartan and hydrochlorothiazide. Continue current meds at current dose

## 2015-08-23 NOTE — Assessment & Plan Note (Signed)
Mr. Kravec was complaining of palpitations a year ago when I had seen him and attack event monitor was completely unremarkable. He's had no recurrent symptoms

## 2015-08-23 NOTE — Progress Notes (Signed)
08/23/2015 Randy Ford   1940/10/05  [id number]  Primary Physician Kandice Hams, MD Primary Cardiologist: Lorretta Harp MD Renae Gloss   HPI:  Randy Ford is a 75 year old mildly overweight married Caucasian male with no children. I last saw him in the office 08/29/14. He has a history of hypertension, hyperlipidemia and obstructive sleep apnea on C Pap. He had a Myoview stress test performed on 06/24/2008 which was negative. His lipid profile followed by his primary care physician Dr. Delfina Redwood during he took an excessive amount of oral iron pills one month ago in attempt to increase his hemoglobin prior to voluntary blood donation after which he developed substernal chest pain which has been constant. His primary care physician thought this could either be "ischemia, reflux, or musculoskeletal. I performed a Myoview stress test on him 08/16/14 which was entirely remote normal as was a 2 week event monitor performed because of palpitations. Since I saw him a year ago he's remained asymptomatic.   Current Outpatient Prescriptions  Medication Sig Dispense Refill  . alfuzosin (UROXATRAL) 10 MG 24 hr tablet Take 10 mg by mouth daily.    Marland Kitchen aspirin 81 MG tablet Take 81 mg by mouth daily.    . Cyanocobalamin (VITAMIN B12 PO) Take 1 tablet by mouth daily.    . fish oil-omega-3 fatty acids 1000 MG capsule Take 2 g by mouth daily.      . folic acid (FOLVITE) A999333 MCG tablet Take 400 mcg by mouth daily.    Marland Kitchen levothyroxine (SYNTHROID, LEVOTHROID) 75 MCG tablet Take 75 mcg by mouth daily.      Marland Kitchen losartan-hydrochlorothiazide (HYZAAR) 50-12.5 MG tablet Take 1 tablet by mouth daily.    . Magnesium 250 MG TABS Take 250 mg by mouth daily.    . simvastatin (ZOCOR) 20 MG tablet Take 20 mg by mouth at bedtime.      . Vitamin D, Cholecalciferol, 1000 UNITS TABS Take 1 tablet by mouth daily.     No current facility-administered medications for this visit.    No Known Allergies  Social History     Social History  . Marital Status: Married    Spouse Name: N/A  . Number of Children: N/A  . Years of Education: N/A   Occupational History  . Retired     Pharmacist, hospital   Social History Main Topics  . Smoking status: Never Smoker   . Smokeless tobacco: Never Used  . Alcohol Use: Yes     Comment: 1/2 beer every 2 weeks  . Drug Use: No  . Sexual Activity: Not on file   Other Topics Concern  . Not on file   Social History Narrative     Review of Systems: General: negative for chills, fever, night sweats or weight changes.  Cardiovascular: negative for chest pain, dyspnea on exertion, edema, orthopnea, palpitations, paroxysmal nocturnal dyspnea or shortness of breath Dermatological: negative for rash Respiratory: negative for cough or wheezing Urologic: negative for hematuria Abdominal: negative for nausea, vomiting, diarrhea, bright red blood per rectum, melena, or hematemesis Neurologic: negative for visual changes, syncope, or dizziness All other systems reviewed and are otherwise negative except as noted above.    Blood pressure 146/72, pulse 82, height 5\' 11"  (1.803 m), weight 203 lb (92.08 kg).  General appearance: alert and no distress Neck: no adenopathy, no carotid bruit, no JVD, supple, symmetrical, trachea midline and thyroid not enlarged, symmetric, no tenderness/mass/nodules Lungs: clear to auscultation bilaterally Heart: regular rate and rhythm, S1,  S2 normal, no murmur, click, rub or gallop Extremities: extremities normal, atraumatic, no cyanosis or edema  EKG normal sinus rhythm at 82 with a ST or T-wave changes. I personally reviewed this EKG  ASSESSMENT AND PLAN:   Essential hypertension History of hypertensive blood pressure measured at 146/72. He is on losartan and hydrochlorothiazide. Continue current meds at current dose  Hyperlipidemia History of hyperlipidemia  On statin therapy followed by his PCP  Chest pain The patient was complaining of  atypical chest pain last year. A Myoview stress test performed 08/16/14 was negative. He's had no recurrent symptoms  Palpitations Randy Ford was complaining of palpitations a year ago when I had seen him and attack event monitor was completely unremarkable. He's had no recurrent symptoms      Lorretta Harp MD [facility], [facility] 08/23/2015 2:20 PM

## 2015-08-23 NOTE — Assessment & Plan Note (Signed)
The patient was complaining of atypical chest pain last year. A Myoview stress test performed 08/16/14 was negative. He's had no recurrent symptoms

## 2015-09-18 ENCOUNTER — Other Ambulatory Visit: Payer: Self-pay | Admitting: Internal Medicine

## 2015-09-18 DIAGNOSIS — N289 Disorder of kidney and ureter, unspecified: Secondary | ICD-10-CM

## 2015-09-19 ENCOUNTER — Ambulatory Visit
Admission: RE | Admit: 2015-09-19 | Discharge: 2015-09-19 | Disposition: A | Discharge status: Home / Self Care | Payer: Medicare Other | Source: Ambulatory Visit | Attending: Internal Medicine | Admitting: Internal Medicine

## 2015-09-19 DIAGNOSIS — N289 Disorder of kidney and ureter, unspecified: Secondary | ICD-10-CM

## 2015-09-20 ENCOUNTER — Other Ambulatory Visit: Payer: Self-pay | Admitting: Internal Medicine

## 2015-09-20 DIAGNOSIS — N1330 Unspecified hydronephrosis: Secondary | ICD-10-CM

## 2015-09-21 ENCOUNTER — Other Ambulatory Visit: Payer: Self-pay | Admitting: Internal Medicine

## 2015-09-21 ENCOUNTER — Ambulatory Visit
Admission: RE | Admit: 2015-09-21 | Discharge: 2015-09-21 | Disposition: A | Discharge status: Home / Self Care | Payer: Medicare Other | Source: Ambulatory Visit | Attending: Internal Medicine | Admitting: Internal Medicine

## 2015-09-21 DIAGNOSIS — N200 Calculus of kidney: Secondary | ICD-10-CM

## 2015-09-21 DIAGNOSIS — N1330 Unspecified hydronephrosis: Secondary | ICD-10-CM

## 2015-09-22 ENCOUNTER — Other Ambulatory Visit: Payer: Self-pay | Admitting: Urology

## 2015-09-22 DIAGNOSIS — R972 Elevated prostate specific antigen [PSA]: Secondary | ICD-10-CM

## 2015-09-25 ENCOUNTER — Inpatient Hospital Stay: Admission: RE | Admit: 2015-09-25 | Payer: Medicare Other | Source: Ambulatory Visit

## 2015-09-26 ENCOUNTER — Other Ambulatory Visit: Payer: Self-pay | Admitting: Urology

## 2015-09-27 ENCOUNTER — Encounter (HOSPITAL_BASED_OUTPATIENT_CLINIC_OR_DEPARTMENT_OTHER): Payer: Self-pay | Admitting: *Deleted

## 2015-09-27 NOTE — Progress Notes (Signed)
Pt instructed npo p mn 5/4 x norvasc w sip of water.  To [facility] 5/5 @ 0930.  Needs istat on arrival.  ekg in epic.  Pt to bring cpap mask and tubing.  Pt states cpap set @ 14 if needed.

## 2015-10-03 ENCOUNTER — Other Ambulatory Visit: Payer: Self-pay | Admitting: Urology

## 2015-10-03 ENCOUNTER — Ambulatory Visit (HOSPITAL_COMMUNITY): Admission: RE | Admit: 2015-10-03 | Payer: Medicare Other | Source: Ambulatory Visit

## 2015-10-03 ENCOUNTER — Ambulatory Visit (HOSPITAL_COMMUNITY)
Admission: RE | Admit: 2015-10-03 | Discharge: 2015-10-03 | Disposition: A | Discharge status: Home / Self Care | Payer: Medicare Other | Source: Ambulatory Visit | Attending: Urology | Admitting: Urology

## 2015-10-03 DIAGNOSIS — R972 Elevated prostate specific antigen [PSA]: Secondary | ICD-10-CM

## 2015-10-03 DIAGNOSIS — N3289 Other specified disorders of bladder: Secondary | ICD-10-CM | POA: Insufficient documentation

## 2015-10-03 DIAGNOSIS — N400 Enlarged prostate without lower urinary tract symptoms: Secondary | ICD-10-CM | POA: Insufficient documentation

## 2015-10-06 ENCOUNTER — Ambulatory Visit (HOSPITAL_BASED_OUTPATIENT_CLINIC_OR_DEPARTMENT_OTHER)
Admission: RE | Admit: 2015-10-06 | Discharge: 2015-10-06 | Disposition: A | Discharge status: Home / Self Care | Payer: Medicare Other | Source: Ambulatory Visit | Attending: Urology | Admitting: Urology

## 2015-10-06 ENCOUNTER — Ambulatory Visit (HOSPITAL_BASED_OUTPATIENT_CLINIC_OR_DEPARTMENT_OTHER): Payer: Medicare Other | Admitting: Certified Registered"

## 2015-10-06 ENCOUNTER — Encounter (HOSPITAL_BASED_OUTPATIENT_CLINIC_OR_DEPARTMENT_OTHER)
Admission: RE | Disposition: A | Discharge status: Home / Self Care | Payer: Self-pay | Source: Ambulatory Visit | Attending: Urology

## 2015-10-06 ENCOUNTER — Encounter (HOSPITAL_BASED_OUTPATIENT_CLINIC_OR_DEPARTMENT_OTHER): Payer: Self-pay | Admitting: Certified Registered"

## 2015-10-06 DIAGNOSIS — R972 Elevated prostate specific antigen [PSA]: Secondary | ICD-10-CM | POA: Insufficient documentation

## 2015-10-06 DIAGNOSIS — Z79899 Other long term (current) drug therapy: Secondary | ICD-10-CM | POA: Insufficient documentation

## 2015-10-06 DIAGNOSIS — N289 Disorder of kidney and ureter, unspecified: Secondary | ICD-10-CM | POA: Diagnosis not present

## 2015-10-06 DIAGNOSIS — N138 Other obstructive and reflux uropathy: Secondary | ICD-10-CM | POA: Insufficient documentation

## 2015-10-06 DIAGNOSIS — E039 Hypothyroidism, unspecified: Secondary | ICD-10-CM | POA: Insufficient documentation

## 2015-10-06 DIAGNOSIS — N401 Enlarged prostate with lower urinary tract symptoms: Secondary | ICD-10-CM | POA: Diagnosis not present

## 2015-10-06 DIAGNOSIS — I10 Essential (primary) hypertension: Secondary | ICD-10-CM | POA: Diagnosis not present

## 2015-10-06 DIAGNOSIS — E785 Hyperlipidemia, unspecified: Secondary | ICD-10-CM | POA: Diagnosis not present

## 2015-10-06 DIAGNOSIS — N1330 Unspecified hydronephrosis: Secondary | ICD-10-CM | POA: Diagnosis not present

## 2015-10-06 DIAGNOSIS — E7800 Pure hypercholesterolemia, unspecified: Secondary | ICD-10-CM | POA: Diagnosis not present

## 2015-10-06 DIAGNOSIS — G4730 Sleep apnea, unspecified: Secondary | ICD-10-CM | POA: Insufficient documentation

## 2015-10-06 HISTORY — DX: Unspecified osteoarthritis, unspecified site: M19.90

## 2015-10-06 HISTORY — DX: Anemia, unspecified: D64.9

## 2015-10-06 HISTORY — DX: Unspecified hydronephrosis: N13.30

## 2015-10-06 HISTORY — DX: Urethral syndrome, unspecified: N34.3

## 2015-10-06 HISTORY — DX: Hypothyroidism, unspecified: E03.9

## 2015-10-06 HISTORY — PX: CYSTOSCOPY W/ URETERAL STENT PLACEMENT: SHX1429

## 2015-10-06 HISTORY — DX: Irritable bowel syndrome, unspecified: K58.9

## 2015-10-06 HISTORY — DX: Presence of spectacles and contact lenses: Z97.3

## 2015-10-06 HISTORY — DX: Nocturia: R35.1

## 2015-10-06 HISTORY — DX: Dermatitis, unspecified: L30.9

## 2015-10-06 HISTORY — DX: Achilles tendinitis, right leg: M76.61

## 2015-10-06 LAB — POCT I-STAT, CHEM 8
BUN: 24 mg/dL — ABNORMAL HIGH (ref 6–20)
CALCIUM ION: 1.22 mmol/L (ref 1.13–1.30)
CHLORIDE: 103 mmol/L (ref 101–111)
CREATININE: 1.7 mg/dL — AB (ref 0.61–1.24)
GLUCOSE: 88 mg/dL (ref 65–99)
HCT: 33 % — ABNORMAL LOW (ref 39.0–52.0)
Hemoglobin: 11.2 g/dL — ABNORMAL LOW (ref 13.0–17.0)
Potassium: 3.8 mmol/L (ref 3.5–5.1)
Sodium: 146 mmol/L — ABNORMAL HIGH (ref 135–145)
TCO2: 27 mmol/L (ref 0–100)

## 2015-10-06 SURGERY — CYSTOSCOPY, WITH RETROGRADE PYELOGRAM AND URETERAL STENT INSERTION
Anesthesia: General | Laterality: Bilateral

## 2015-10-06 MED ORDER — FENTANYL CITRATE (PF) 100 MCG/2ML IJ SOLN
INTRAMUSCULAR | Status: AC
  Filled 2015-10-06: qty 2

## 2015-10-06 MED ORDER — LIDOCAINE HCL (CARDIAC) 20 MG/ML IV SOLN
INTRAVENOUS | Status: AC
  Filled 2015-10-06: qty 5

## 2015-10-06 MED ORDER — MEPERIDINE HCL 25 MG/ML IJ SOLN
6.2500 mg | INTRAMUSCULAR | Status: DC | PRN
  Filled 2015-10-06: qty 1

## 2015-10-06 MED ORDER — LACTATED RINGERS IV SOLN
INTRAVENOUS | Status: DC
  Administered 2015-10-06: 11:00:00 via INTRAVENOUS
  Filled 2015-10-06: qty 1000

## 2015-10-06 MED ORDER — ONDANSETRON HCL 4 MG/2ML IJ SOLN
INTRAMUSCULAR | Status: AC
Start: 2015-10-06 — End: 2015-10-06
  Filled 2015-10-06: qty 2

## 2015-10-06 MED ORDER — SODIUM CHLORIDE 0.9 % IR SOLN
Status: DC | PRN
  Administered 2015-10-06: 15000 mL via INTRAVESICAL
  Administered 2015-10-06: 1000 mL via INTRAVESICAL

## 2015-10-06 MED ORDER — FENTANYL CITRATE (PF) 100 MCG/2ML IJ SOLN
INTRAMUSCULAR | Status: DC | PRN
  Administered 2015-10-06: 25 ug via INTRAVENOUS
  Administered 2015-10-06 (×2): 50 ug via INTRAVENOUS

## 2015-10-06 MED ORDER — DEXAMETHASONE SODIUM PHOSPHATE 10 MG/ML IJ SOLN
INTRAMUSCULAR | Status: AC
  Filled 2015-10-06: qty 1

## 2015-10-06 MED ORDER — PROPOFOL 10 MG/ML IV BOLUS
INTRAVENOUS | Status: DC | PRN
  Administered 2015-10-06: 180 mg via INTRAVENOUS

## 2015-10-06 MED ORDER — CIPROFLOXACIN IN D5W 400 MG/200ML IV SOLN
400.0000 mg | INTRAVENOUS | Status: AC
  Administered 2015-10-06: 400 mg via INTRAVENOUS
  Filled 2015-10-06: qty 200

## 2015-10-06 MED ORDER — LACTATED RINGERS IV SOLN
INTRAVENOUS | Status: DC
  Filled 2015-10-06: qty 1000

## 2015-10-06 MED ORDER — FENTANYL CITRATE (PF) 100 MCG/2ML IJ SOLN
25.0000 ug | INTRAMUSCULAR | Status: DC | PRN
  Filled 2015-10-06: qty 1

## 2015-10-06 MED ORDER — TRAMADOL HCL 50 MG PO TABS: 50.0000 mg | ORAL_TABLET | Freq: Four times a day (QID) | ORAL | Status: DC | PRN

## 2015-10-06 MED ORDER — DIATRIZOATE MEGLUMINE 30 % UR SOLN
URETHRAL | Status: DC | PRN
Start: 2015-10-06 — End: 2015-10-06
  Administered 2015-10-06: 160 mL via URETHRAL

## 2015-10-06 MED ORDER — FLUORESCEIN SODIUM 10 % IV SOLN
INTRAVENOUS | Status: AC
  Filled 2015-10-06: qty 5

## 2015-10-06 MED ORDER — DEXAMETHASONE SODIUM PHOSPHATE 4 MG/ML IJ SOLN
INTRAMUSCULAR | Status: DC | PRN
  Administered 2015-10-06: 10 mg via INTRAVENOUS

## 2015-10-06 MED ORDER — LIDOCAINE HCL (CARDIAC) 20 MG/ML IV SOLN
INTRAVENOUS | Status: DC | PRN
  Administered 2015-10-06: 60 mg via INTRAVENOUS

## 2015-10-06 MED ORDER — ONDANSETRON HCL 4 MG/2ML IJ SOLN
INTRAMUSCULAR | Status: DC | PRN
  Administered 2015-10-06: 4 mg via INTRAVENOUS

## 2015-10-06 MED ORDER — CIPROFLOXACIN IN D5W 400 MG/200ML IV SOLN
INTRAVENOUS | Status: AC
  Filled 2015-10-06: qty 200

## 2015-10-06 MED ORDER — FLUORESCEIN SODIUM 10 % IV SOLN
INTRAVENOUS | Status: DC | PRN
Start: 2015-10-06 — End: 2015-10-06
  Administered 2015-10-06: 500 mg via INTRAVENOUS

## 2015-10-06 MED ORDER — PROMETHAZINE HCL 25 MG/ML IJ SOLN
6.2500 mg | INTRAMUSCULAR | Status: DC | PRN
  Filled 2015-10-06: qty 1

## 2015-10-06 MED ORDER — PROPOFOL 10 MG/ML IV BOLUS
INTRAVENOUS | Status: AC
  Filled 2015-10-06: qty 20

## 2015-10-06 SURGICAL SUPPLY — 41 items
ADAPTER CATH URET PLST 4-6FR (CATHETERS) IMPLANT
ADPR CATH URET STRL DISP 4-6FR (CATHETERS)
BAG DRAIN URO-CYSTO SKYTR STRL (DRAIN) ×3 IMPLANT
BAG DRN UROCATH (DRAIN) ×1
BASKET LASER NITINOL 1.9FR (BASKET) IMPLANT
BASKET STNLS GEMINI 4WIRE 3FR (BASKET) IMPLANT
BASKET ZERO TIP NITINOL 2.4FR (BASKET) IMPLANT
BSKT STON RTRVL 120 1.9FR (BASKET)
BSKT STON RTRVL GEM 120X11 3FR (BASKET)
BSKT STON RTRVL ZERO TP 2.4FR (BASKET)
CANISTER SUCT LVC 12 LTR MEDI- (MISCELLANEOUS) ×3 IMPLANT
CATH INTERMIT  6FR 70CM (CATHETERS) IMPLANT
CATH URET 5FR 28IN CONE TIP (BALLOONS)
CATH URET 5FR 28IN OPEN ENDED (CATHETERS) ×3 IMPLANT
CATH URET 5FR 70CM CONE TIP (BALLOONS) IMPLANT
CATH URET DUAL LUMEN 6-10FR 50 (CATHETERS) IMPLANT
CLOTH BEACON ORANGE TIMEOUT ST (SAFETY) ×3 IMPLANT
DRSG TEGADERM 2-3/8X2-3/4 SM (GAUZE/BANDAGES/DRESSINGS) IMPLANT
FIBER LASER FLEXIVA 365 (UROLOGICAL SUPPLIES) IMPLANT
FIBER LASER TRAC TIP (UROLOGICAL SUPPLIES) IMPLANT
GLOVE BIO SURGEON STRL SZ7.5 (GLOVE) ×3 IMPLANT
GOWN STRL REUS W/ TWL XL LVL3 (GOWN DISPOSABLE) ×1 IMPLANT
GOWN STRL REUS W/TWL XL LVL3 (GOWN DISPOSABLE) ×3
GUIDEWIRE 0.038 PTFE COATED (WIRE) IMPLANT
GUIDEWIRE ANG ZIPWIRE 038X150 (WIRE) ×2 IMPLANT
GUIDEWIRE STR DUAL SENSOR (WIRE) ×5 IMPLANT
IV NS IRRIG 3000ML ARTHROMATIC (IV SOLUTION) ×12 IMPLANT
KIT BALLIN UROMAX 15FX10 (LABEL) IMPLANT
KIT BALLN UROMAX 15FX4 (MISCELLANEOUS) IMPLANT
KIT BALLN UROMAX 26 75X4 (MISCELLANEOUS)
KIT ROOM TURNOVER WOR (KITS) ×3 IMPLANT
MANIFOLD NEPTUNE II (INSTRUMENTS) IMPLANT
NS IRRIG 500ML POUR BTL (IV SOLUTION) IMPLANT
PACK CYSTO (CUSTOM PROCEDURE TRAY) ×3 IMPLANT
SET HIGH PRES BAL DIL (LABEL)
SHEATH ACCESS URETERAL 38CM (SHEATH) IMPLANT
STENT URET 6FRX26 CONTOUR (STENTS) ×4 IMPLANT
SYR 20CC LL (SYRINGE) ×2 IMPLANT
TUBE CONNECTING 12'X1/4 (SUCTIONS)
TUBE CONNECTING 12X1/4 (SUCTIONS) IMPLANT
TUBE FEEDING 8FR 16IN STR KANG (MISCELLANEOUS) IMPLANT

## 2015-10-06 NOTE — Anesthesia Procedure Notes (Signed)
Procedure Name: LMA Insertion Date/Time: 10/06/2015 10:57 AM Performed by: Denna Haggard D Pre-anesthesia Checklist: Patient identified, Emergency Drugs available, Suction available and Patient being monitored Patient Re-evaluated:Patient Re-evaluated prior to inductionOxygen Delivery Method: Circle System Utilized Preoxygenation: Pre-oxygenation with 100% oxygen Intubation Type: IV induction Ventilation: Mask ventilation without difficulty LMA: LMA inserted LMA Size: 4.0 Number of attempts: 1 Airway Equipment and Method: Bite block Placement Confirmation: positive ETCO2 Tube secured with: Tape Dental Injury: Teeth and Oropharynx as per pre-operative assessment

## 2015-10-06 NOTE — H&P (Signed)
History of Present Illness Randy Ford is seen today by request of his primary care doctor, Dr. Jori Ford polite, M.D. for newly diagnosed hydronephrosis. Previously, the patient had seen Dr. Janice Ford, he had five negative prostate biopsies in the past for elevated PSA. His PSA has been around 7.8 and 8.0 for the past year and has been as high as 14 in November 2011.     Interval: The patient was seen here approximately 4 weeks ago for relatively acute onset dysuria, frequency, and urgency. He was noted not to have a urinary tract infection although his urine did have numerous bacteria as well as white blood cells. The patient was initially started on nitrofurantoin having a notably nontender prostate. His symptoms did not resolve. He subsequently followed up with his primary care doctor who ordered a renal ultrasound which demonstrated bilateral hydronephrosis. This was then followed by a CT scan which did not show any kidney or obstructing ureteral stones. It did confirm that the patient had bilateral moderate to severe hydronephrosis. The patient's creatinine is fairly stable at 1.8. His PSA was also stable at around 10.17. The patient has not had any gross hematuria. He continues to have some voiding symptoms. He denies any fevers or chills. He denies any associated suprapubic or flank pain. The patient has a known enlarged prostate and on the CT scan his prostate is indenting into the bladder. His bladder is thin-walled and otherwise normal-appearing.    He had a negative hematuria work-up in April 2000.   Past Medical History Problems  1. History of heartburn (Z87.898) 2. History of hypercholesterolemia (Z86.39) 3. History of hypertension (Z86.79) 4. History of hypothyroidism (Z86.39) 5. History of sleep apnea (Z86.69)  Surgical History Problems  1. History of Arm Incision 2. History of Eye Surgery 3. History of Tonsillectomy  Current Meds 1. Alfuzosin HCl ER 10 MG Oral Tablet Extended Release  24 Hour; Take 1 tablet at bedtime;  Therapy: [id number] to (Evaluate:02Apr2018)  Requested for: 07Apr2017; Last  Rx:07Apr2017; Status: ACTIVE - Retrospective Authorization Ordered 2. DiazePAM 10 MG Oral Tablet; TAKE 2 TABLET Once 1-2 tablets one hour prior to the  procedure;  Therapy: 21Apr2017 to (Evaluate:22Apr2017); Last Rx:21Apr2017 Ordered 3. Folic Acid A999333 MCG Oral Tablet;  Therapy: ([id number]) to Recorded 4. Levoxyl 75 MCG Oral Tablet;  Therapy: 20Sep2010 to Recorded 5. Losartan Potassium 25 MG Oral Tablet;  Therapy: (Recorded:24Mar2017) to Recorded 6. Tylenol CAPS;  Therapy: (Recorded:10Jan2008) to Recorded 7. Vitamin D TABS;  Therapy: (Recorded:09Jul2012) to Recorded 8. Zocor 20 MG Oral Tablet (Simvastatin);  Therapy: (Recorded:09Jun2009) to Recorded  Allergies Medication  1. No Known Drug Allergies  Family History Problems  1. Family history of Alzheimer Disease : Mother 2. Family history of Alzheimer Disease : Maternal Uncle 3. Family history of Bladder Cancer : Mother 4. Family history of Death In The Family Father   56 5. Family history of Death In The Family Mother   11/17/1979. Family history of Lung Cancer : Father 16. Family history of Prostate Cancer : Paternal Uncle  Social History Problems  1. Alcohol Use (History)   less than one 2. Caffeine Use   2 3. Marital History - Currently Married 4. Never A Smoker 5. Occupation: Retired  Review of Systems Patient has no trouble climbing stairs, denies any dyspnea on exertion, or history of chest pain.   Vitals Vital Signs [Data Includes: Last 1 Day]  Recorded: 21Apr2017 11:05AM  Blood Pressure: 155 / 72 Heart Rate: 64  Physical Exam Constitutional:  Well nourished and well developed . No acute distress.  ENT:. The ears and nose are normal in appearance.  Neck: The appearance of the neck is normal and no neck mass is present.  Pulmonary: No respiratory distress and normal respiratory rhythm and  effort.  Cardiovascular: Heart rate and rhythm are normal . No peripheral edema.  Abdomen: The abdomen is soft and nontender.    Results/Data Urine [Data Includes: Last 1 Day]   21Apr2017  COLOR YELLOW   APPEARANCE CLOUDY   SPECIFIC GRAVITY 1.015   pH 6.0   GLUCOSE NEGATIVE   BILIRUBIN NEGATIVE   KETONE NEGATIVE   BLOOD 2+   PROTEIN TRACE   NITRITE NEGATIVE   LEUKOCYTE ESTERASE 3+   SQUAMOUS EPITHELIAL/HPF 0-5 HPF  WBC >60 WBC/HPF  RBC 3-10 RBC/HPF  BACTERIA NONE SEEN HPF  CRYSTALS See Below HPF  CASTS NONE SEEN LPF  Yeast NONE SEEN HPF   The patient's urinalysis demonstrates an inflammatory process with microscopic hematuria, no evidence of infection.  I have independently reviewed both the patient's ultrasound and CT scan as well as reviewed the reports.   Assessment Assessed  1. Elevated prostate specific antigen (PSA) (R97.20)  Plan Elevated prostate specific antigen (PSA)  1. Follow-up Schedule Surgery Office  Follow-up  Status: Hold For - Appointment   Requested for: 21Apr2017 Health Maintenance  2. UA With REFLEX; [Do Not Release]; Status:Complete;   Done: 21Apr2017 10:33AM  Discussion/Summary The patient has an elevated PSA with a history of negative prostate biopsy. He has not had any biopsies in quite some time although his PSA has remained stable. I have ordered an MRI to ensure that the patient has not developed any high-grade lesions.  The patient's bilateral hydronephrosis is new. This is in the setting of renal insufficiency although I'm not sure whether this is chronic or acute. Either way, the hydronephrosis is new and is creatinine limits him from the ability to obtain a hematuria protocol CT scan. As such, I recommended that we proceed to the operating room for cystoscopy, bilateral retrograde pyelogram and ureteral stent placement. We would then recheck his creatinine after the stents were placed. It does appear that the patient's most likely etiology  of his ureteral obstruction is from his prostate. If that is also the case after further evaluation he may be a good candidate for an open simple prostatectomy. While the patient is under general anesthesia we would also try to do a prostate biopsy if necessary, based on the results of his MRI scan.

## 2015-10-06 NOTE — Discharge Instructions (Signed)
DISCHARGE INSTRUCTIONS FOR URETERAL STENT   MEDICATIONS:  1.  Resume all your other meds from home - except do not take any extra narcotic pain meds that you may have at home.  2. Tramadol is for moderate pain, take Tylenol 500-1000mg  every 6 hours for less severe pain.  ACTIVITY:  1. No strenuous activity x 1week  2. No driving while on narcotic pain medications  3. Drink plenty of water  4. Continue to walk at home - you can still get blood clots when you are at home, so keep active, but don't over do it.  5. May return to work/school tomorrow or when you feel ready   BATHING:  1. You can shower and we recommend daily showers   SIGNS/SYMPTOMS TO CALL:  Please call us if you have a fever greater than 101.5, uncontrolled nausea/vomiting, uncontrolled pain, dizziness, unable to urinate, bloody urine, chest pain, shortness of breath, leg swelling, leg pain, redness around wound, drainage from wound, or any other concerns or questions.   You can reach [place] at [phone].   FOLLOW-UP:  1. You will be scheduled for an appointment in 3-4weeks with a labs for your kidneys prior.      [facility] [phone] Post Ureteroscopy With or Without Stent Instructions  Definitions:  Ureter: The duct that transports urine from the kidney to the bladder. Stent:   A plastic hollow tube that is placed into the ureter, from the kidney to the bladder to prevent the ureter from swelling shut.  GENERAL INSTRUCTIONS:  Despite the fact that no skin incisions were used, the area around the ureter and bladder is raw and irritated. The stent is a foreign body which will further irritate the bladder wall. This irritation is manifested by increased frequency of urination, both day and night, and by an increase in the urge to urinate. In some, the urge to urinate is present almost always. Sometimes the urge is strong enough that you may not be able to stop yourself from urinating. The only  real cure is to remove the stent and then give time for the bladder wall to heal which can't be done until the danger of the ureter swelling shut has passed, which varies.  You may see some blood in your urine while the stent is in place and a few days afterwards. Do not be alarmed, even if the urine was clear for a while. Get off your feet and drink lots of fluids until clearing occurs. If you start to pass clots or don't improve, call us.  DIET: You may return to your normal diet immediately. Because of the raw surface of your bladder, alcohol, spicy foods, acid type foods and drinks with caffeine may cause irritation or frequency and should be used in moderation. To keep your urine flowing freely and to avoid constipation, drink plenty of fluids during the day ( 8-10 glasses ). Tip: Avoid cranberry juice because it is very acidic.  ACTIVITY: Your physical activity doesn't need to be restricted. However, if you are very active, you may see some blood in your urine. We suggest that you reduce your activity under these circumstances until the bleeding has stopped.  BOWELS: It is important to keep your bowels regular during the postoperative period. Straining with bowel movements can cause bleeding. A bowel movement every other day is reasonable. Use a mild laxative if needed, such as Milk of Magnesia 2-3 tablespoons, or 2 Dulcolax tablets. Call if you continue to have problems.  If you have been taking narcotics for pain, before, during or after your surgery, you may be constipated. Take a laxative if necessary.   MEDICATION: You should resume your pre-surgery medications unless told not to. In addition you will often be given an antibiotic to prevent infection. These should be taken as prescribed until the bottles are finished unless you are having an unusual reaction to one of the drugs.  PROBLEMS YOU SHOULD REPORT TO [place]:  Fevers over 100.5 Fahrenheit.  Heavy bleeding, or clots ( See above  notes about blood in urine ).  Inability to urinate.  Drug reactions ( hives, rash, nausea, vomiting, diarrhea ).  Severe burning or pain with urination that is not improving.  FOLLOW-UP: You will need a follow-up appointment to monitor your progress. Call for this appointment at the number listed above. Usually the first appointment will be about three to fourteen days after your surgery.     Post Anesthesia Home Care Instructions  Activity: Get plenty of rest for the remainder of the day. A responsible adult should stay with you for 24 hours following the procedure.  For the next 24 hours, DO NOT: -Drive a car -Paediatric nurse -Drink alcoholic beverages -Take any medication unless instructed by your physician -Make any legal decisions or sign important papers.  Meals: Start with liquid foods such as gelatin or soup. Progress to regular foods as tolerated. Avoid greasy, spicy, heavy foods. If nausea and/or vomiting occur, drink only clear liquids until the nausea and/or vomiting subsides. Call your physician if vomiting continues.  Special Instructions/Symptoms: Your throat may feel dry or sore from the anesthesia or the breathing tube placed in your throat during surgery. If this causes discomfort, gargle with warm salt water. The discomfort should disappear within 24 hours.  If you had a scopolamine patch placed behind your ear for the management of post- operative nausea and/or vomiting:  1. The medication in the patch is effective for 72 hours, after which it should be removed.  Wrap patch in a tissue and discard in the trash. Wash hands thoroughly with soap and water. 2. You may remove the patch earlier than 72 hours if you experience unpleasant side effects which may include dry mouth, dizziness or visual disturbances. 3. Avoid touching the patch. Wash your hands with soap and water after contact with the patch.

## 2015-10-06 NOTE — Op Note (Signed)
Preoperative diagnosis:  1. Bilateral hydronephrosis   Postoperative diagnosis:  1. Same 2. Bladder outlet obstruction secondary to enlarged prostate   Procedure: 1. Cystoscopy 2. Bilateral retrograde pyelogram with interpretation 3. Right ureteroscopy 4. Bilateral ureteral stent placement  Surgeon: Ardis Hughs, MD  Anesthesia: General  Complications: None  Intraoperative findings:  #1: The patient had a very high median bar and a very large left obstructing lateral lobe.  The patient had a large portion of intravesical median lobe. #2: The ureteral orifices were difficult to find but ultimately I was able to cannulate both ureters and perform retrograde pyelograms. #3: The right retrograde pyelogram demonstrated a distal ureteral hydronephrosis to the level of the bladder.  There was a rent in the ureter at the level of the sacroiliac joint/a narrowing of the ureter.  The ureter above this area was also dilated with a very dilated renal pelvis and blunting of the calyces.  There was also medial ureteral deviation. #4: The left retrograde pyelogram demonstrated J hooking of the ureter with a torturous and dilated ureter all the way down to the intravesical ureter.  The renal pelvis was severely dilated with blunting of the calyces. #5: I performed a right-sided ureteroscopy to evaluate this rent/strictured area.  It was no obvious abnormality within the ureteral wall to explain this narrowed area.  There were no filling defects or abnormalities aside from dilation all the way up to the renal pelvis. #6: 28 cm 6 French double-J stents were placed in both ureters.  EBL: Minimal  Specimens: None  Indication: Randy Ford is a 75 y.o. patient with Hydronephrosis and worsening renal function.  A CT scan demonstrated this clearly without evidence of stones or obstructing etiology of his ureters..  After reviewing the management options for treatment, he elected to proceed with the  above surgical procedure(s). We have discussed the potential benefits and risks of the procedure, side effects of the proposed treatment, the likelihood of the patient achieving the goals of the procedure, and any potential problems that might occur during the procedure or recuperation. Informed consent has been obtained.  Description of procedure:  The patient was taken to the operating room and general anesthesia was induced.  The patient was placed in the dorsal lithotomy position, prepped and draped in the usual sterile fashion, and preoperative antibiotics were administered. A preoperative time-out was performed.   A 30 21 French cystoscope was then gently passed through the patient's urethra and into the bladder.  A 360 cystoscopic evaluation was performed with no mucosal abnormalities.  The patient did have a very enlarged bladder.  There were many trabeculations but no obvious cellules or diverticulum.  The 30 lens was exchanged for the 70 lens to facilitate localizing the ureteral orifice.  Ultimately, I was able to identify the patient's ureteral orifice after giving fluorocine contrast.  I then performed retrograde pyelograms with the 5 French open-ended ureteral catheter.  The collecting system was severely dilated and I used approximately 70 cc of contrast on the patient's right side.  I then advanced a wire up the ureter and given the abnormality as described above used a 6 French semirigid ureteroscope to visualize the inside of the ureter all the way up to the renal pelvis.  The above findings were then noted.  I then removed the ureteroscope and passed the 28 cm 6 French double-J stent up to the right renal pelvis under fluoroscopic guidance.  Once the stent was noted to be in the  right renal pelvis and advanced all the way out so that the tip was in the bladder I removed the wire.   We then turned our attention to the patient's left ureteral orifice which were able to find a little more  easily.  I cannulated the ureteral orifice using a 5 [place] open-ended ureteral catheter and there was a severe J hook with significant distal ureteral tortuosity.  Again, the collecting system was very dilated and approximately 30 cc of Omnipaque contrast was used to delineate the ureter.  I then injected 30 more cc to better delineate the renal pelvis and calyces.  I then attempted to pass a 0.38 sensor wire through the ureteral catheter and into the left renal pelvis.  I was unable to advance the wire beyond the J hook initially.  Ultimately, I was able to get a Glidewire to advance of the left ureter.  I then was able to advance the open-ended catheter over the wire and into the proximal ureter.  Then with some gentle tension on the wire and the catheter the ureter did straighten out allowing me to place a 6 [place] double-J 28 cm stent unremarkable.  Once the stent was finally placed, the case was terminated.  I opted not to leave a B and O suppositories so that the patient did not inadvertently develop urinary retention.  He was awoken and returned to the PACU in good condition.  Ardis Hughs, M.D.

## 2015-10-06 NOTE — Anesthesia Preprocedure Evaluation (Addendum)
Anesthesia Evaluation  Patient identified by MRN, date of birth, ID band Patient awake    Reviewed: Allergy & Precautions, NPO status , Patient's Chart, lab work & pertinent test results  Airway Mallampati: I  TM Distance: >3 FB Neck ROM: Full    Dental  (+) Teeth Intact, Dental Advisory Given   Pulmonary sleep apnea ,    breath sounds clear to auscultation       Cardiovascular hypertension, Pt. on medications  Rhythm:Regular Rate:Normal     Neuro/Psych negative neurological ROS  negative psych ROS   GI/Hepatic negative GI ROS, Neg liver ROS,   Endo/Other  Hypothyroidism   Renal/GU Renal disease  negative genitourinary   Musculoskeletal  (+) Arthritis ,   Abdominal   Peds negative pediatric ROS (+)  Hematology   Anesthesia Other Findings - HLD - IBS  Reproductive/Obstetrics                            08/2015 EKG: normal sinus rhythm.   Anesthesia Physical Anesthesia Plan  ASA: II  Anesthesia Plan: General   Post-op Pain Management:    Induction: Intravenous  Airway Management Planned: LMA  Additional Equipment:   Intra-op Plan:   Post-operative Plan: Extubation in OR  Informed Consent: I have reviewed the patients History and Physical, chart, labs and discussed the procedure including the risks, benefits and alternatives for the proposed anesthesia with the patient or authorized representative who has indicated his/her understanding and acceptance.   Dental advisory given  Plan Discussed with: CRNA  Anesthesia Plan Comments:         Anesthesia Quick Evaluation

## 2015-10-06 NOTE — Transfer of Care (Signed)
Immediate Anesthesia Transfer of Care Note  Patient: Danieljoseph Ulven  Procedure(s) Performed: Procedure(s) (LRB): CYSTOSCOPY WITH BILATERAL RETROGRADE PYELOGRAM BILATERAL URETERAL STENT PLACEMENT WITH LEFT URETEROSCOPY (Bilateral)  Patient Location: PACU  Anesthesia Type: General  Level of Consciousness: awake, oriented, sedated and patient cooperative  Airway & Oxygen Therapy: Patient Spontanous Breathing and Patient connected to face mask oxygen  Post-op Assessment: Report given to PACU RN and Post -op Vital signs reviewed and stable  Post vital signs: Reviewed and stable  Complications: No apparent anesthesia complications

## 2015-10-06 NOTE — Interval H&P Note (Signed)
History and Physical Interval Note:  10/06/2015 10:41 AM  Randy Ford  has presented today for surgery, with the diagnosis of BILATERAL HYDRONEPHROSIS   The various methods of treatment have been discussed with the patient and family. After consideration of risks, benefits and other options for treatment, the patient has consented to  Procedure(s): CYSTOSCOPY WITH BILATERAL RETROGRADE PYELOGRAM [place] (Bilateral) as a surgical intervention .  The patient's history has been reviewed, patient examined, no change in status, stable for surgery.  I have reviewed the patient's chart and labs.  Questions were answered to the patient's satisfaction.     Louis Meckel W

## 2015-10-06 NOTE — Anesthesia Postprocedure Evaluation (Signed)
Anesthesia Post Note  Patient: Randy Ford  Procedure(s) Performed: Procedure(s) (LRB): CYSTOSCOPY WITH BILATERAL RETROGRADE PYELOGRAM BILATERAL URETERAL STENT PLACEMENT WITH LEFT URETEROSCOPY (Bilateral)  Patient location during evaluation: PACU Anesthesia Type: General Level of consciousness: awake and alert Pain management: pain level controlled Vital Signs Assessment: post-procedure vital signs reviewed and stable Respiratory status: spontaneous breathing, nonlabored ventilation, respiratory function stable and patient connected to nasal cannula oxygen Cardiovascular status: blood pressure returned to baseline and stable Postop Assessment: no signs of nausea or vomiting Anesthetic complications: no    Last Vitals:  Filed Vitals:   10/06/15 1315 10/06/15 1330  BP:  153/73  Pulse: 62 72  Temp:    Resp: 11 18    Last Pain: There were no vitals filed for this visit.               Effie Berkshire

## 2015-10-09 ENCOUNTER — Encounter (HOSPITAL_BASED_OUTPATIENT_CLINIC_OR_DEPARTMENT_OTHER): Payer: Self-pay | Admitting: Urology

## 2015-10-31 ENCOUNTER — Other Ambulatory Visit: Payer: Self-pay | Admitting: Internal Medicine

## 2015-10-31 ENCOUNTER — Ambulatory Visit
Admission: RE | Admit: 2015-10-31 | Discharge: 2015-10-31 | Disposition: A | Discharge status: Home / Self Care | Payer: Medicare Other | Source: Ambulatory Visit | Attending: Internal Medicine | Admitting: Internal Medicine

## 2015-10-31 DIAGNOSIS — R140 Abdominal distension (gaseous): Secondary | ICD-10-CM

## 2015-11-01 ENCOUNTER — Encounter (HOSPITAL_COMMUNITY): Payer: Self-pay | Admitting: Emergency Medicine

## 2015-11-01 ENCOUNTER — Emergency Department (HOSPITAL_COMMUNITY)
Admission: EM | Admit: 2015-11-01 | Discharge: 2015-11-02 | Disposition: A | Discharge status: Home / Self Care | Payer: Medicare Other | Attending: Emergency Medicine | Admitting: Emergency Medicine

## 2015-11-01 DIAGNOSIS — Z79899 Other long term (current) drug therapy: Secondary | ICD-10-CM | POA: Diagnosis not present

## 2015-11-01 DIAGNOSIS — E039 Hypothyroidism, unspecified: Secondary | ICD-10-CM | POA: Insufficient documentation

## 2015-11-01 DIAGNOSIS — M1990 Unspecified osteoarthritis, unspecified site: Secondary | ICD-10-CM | POA: Diagnosis not present

## 2015-11-01 DIAGNOSIS — N390 Urinary tract infection, site not specified: Secondary | ICD-10-CM | POA: Diagnosis not present

## 2015-11-01 DIAGNOSIS — I10 Essential (primary) hypertension: Secondary | ICD-10-CM | POA: Diagnosis not present

## 2015-11-01 DIAGNOSIS — Z7982 Long term (current) use of aspirin: Secondary | ICD-10-CM | POA: Insufficient documentation

## 2015-11-01 DIAGNOSIS — E785 Hyperlipidemia, unspecified: Secondary | ICD-10-CM | POA: Insufficient documentation

## 2015-11-01 DIAGNOSIS — R34 Anuria and oliguria: Secondary | ICD-10-CM | POA: Diagnosis present

## 2015-11-01 DIAGNOSIS — R339 Retention of urine, unspecified: Secondary | ICD-10-CM | POA: Insufficient documentation

## 2015-11-01 LAB — URINALYSIS, ROUTINE W REFLEX MICROSCOPIC
BILIRUBIN URINE: NEGATIVE
Bilirubin Urine: NEGATIVE
GLUCOSE, UA: NEGATIVE mg/dL
Glucose, UA: NEGATIVE mg/dL
Ketones, ur: NEGATIVE mg/dL
Ketones, ur: NEGATIVE mg/dL
NITRITE: NEGATIVE
Nitrite: NEGATIVE
PH: 6 (ref 5.0–8.0)
PH: 6 (ref 5.0–8.0)
Protein, ur: 100 mg/dL — AB
Protein, ur: 100 mg/dL — AB
SPECIFIC GRAVITY, URINE: 1.013 (ref 1.005–1.030)
SPECIFIC GRAVITY, URINE: 1.013 (ref 1.005–1.030)

## 2015-11-01 LAB — URINE MICROSCOPIC-ADD ON

## 2015-11-01 LAB — CBC
HCT: 27 % — ABNORMAL LOW (ref 39.0–52.0)
Hemoglobin: 9.7 g/dL — ABNORMAL LOW (ref 13.0–17.0)
MCH: 30.7 pg (ref 26.0–34.0)
MCHC: 35.9 g/dL (ref 30.0–36.0)
MCV: 85.4 fL (ref 78.0–100.0)
PLATELETS: 216 10*3/uL (ref 150–400)
RBC: 3.16 MIL/uL — ABNORMAL LOW (ref 4.22–5.81)
RDW: 13.2 % (ref 11.5–15.5)
WBC: 9.2 10*3/uL (ref 4.0–10.5)

## 2015-11-01 LAB — BASIC METABOLIC PANEL
Anion gap: 9 (ref 5–15)
BUN: 29 mg/dL — AB (ref 6–20)
CALCIUM: 9.1 mg/dL (ref 8.9–10.3)
CO2: 24 mmol/L (ref 22–32)
Chloride: 103 mmol/L (ref 101–111)
Creatinine, Ser: 2.46 mg/dL — ABNORMAL HIGH (ref 0.61–1.24)
GFR calc Af Amer: 28 mL/min — ABNORMAL LOW (ref >60)
GFR, EST NON AFRICAN AMERICAN: 24 mL/min — AB (ref >60)
GLUCOSE: 120 mg/dL — AB (ref 65–99)
Potassium: 3.4 mmol/L — ABNORMAL LOW (ref 3.5–5.1)
Sodium: 136 mmol/L (ref 135–145)

## 2015-11-01 NOTE — ED Notes (Signed)
Dr.Pfeiffer notified of bladder scan showing greater than 999.

## 2015-11-01 NOTE — ED Provider Notes (Signed)
CSN: [id number]     Arrival date & time 11/01/15  1928 History   First MD Initiated Contact with Patient 11/01/15 1957     No chief complaint on file.    (Consider location/radiation/quality/duration/timing/severity/associated sxs/prior Treatment) HPI Patient has been having chills and sweats for approximately 3 days. His maximum temperature was 101.3 today. He reports he's been having decreasing urine output for a portion of the day. He reports that he continue to drink plenty fluids but was only putting a small dribble of urine out. He denies any vomiting. No diarrhea or constipation. Pain, shortness of breath or cough. Patient reports that he contacted his physician, Dr. Delfina Redwood and based on his symptoms, he had been started on ciprofloxacin. He reports he's had a total of 4 doses of ciprofloxacin.   The patient had bilateral renal stents placed in May 5 Dr. Louis Meckel. He was not having any problems until about 3 days ago when chills and sweats started. Past Medical History  Diagnosis Date  . Hypertension   . Hyperlipidemia   . OSA (obstructive sleep apnea) 08/08/2008    AHI 1.09/hr  . Chest pain     denies presently; myoview,holter monitor wnl; Dr Gwenlyn Found cardiologist  . Hypothyroidism   . Hydronephrosis, bilateral   . Dysuria-frequency syndrome   . Nocturia   . IBS (irritable bowel syndrome)   . Anemia   . Arthritis     osteoarthritis rt foot; gout  . Tendonitis, Achilles, right   . Wears glasses   . Eczema    Past Surgical History  Procedure Laterality Date  . Cataract extraction      x2  . Tonsillectomy  1948  . Eye surgery Bilateral     detached retina  . Mass excision Bilateral     fatty tumors rmoved from arms  . Cystoscopy w/ ureteral stent placement Bilateral 10/06/2015    Procedure: CYSTOSCOPY WITH BILATERAL RETROGRADE PYELOGRAM BILATERAL URETERAL STENT PLACEMENT WITH LEFT URETEROSCOPY;  Surgeon: Ardis Hughs, MD;  Location: [facility];  Service:  Urology;  Laterality: Bilateral;   Family History  Problem Relation Age of Onset  . Cancer Mother     Bladder  . Dementia Mother   . Cancer Father     spinal   Social History  Substance Use Topics  . Smoking status: Never Smoker   . Smokeless tobacco: Never Used  . Alcohol Use: Yes     Comment: 1/2 beer every 2 weeks    Review of Systems 10 Systems reviewed and are negative for acute change except as noted in the HPI.    Allergies  Review of patient's allergies indicates no known allergies.  Home Medications   Prior to Admission medications   Medication Sig Start Date End Date Taking? Authorizing Provider  acetaminophen (TYLENOL) 500 MG tablet Take 500 mg by mouth every 6 (six) hours as needed for fever.   Yes Historical Provider, MD  alfuzosin (UROXATRAL) 10 MG 24 hr tablet Take 10 mg by mouth daily. 08/11/15  Yes Historical Provider, MD  amLODipine (NORVASC) 5 MG tablet Take 5 mg by mouth daily.   Yes Historical Provider, MD  aspirin 81 MG tablet Take 81 mg by mouth daily.   Yes Historical Provider, MD  aspirin EC 81 MG tablet Take 81 mg by mouth daily.   Yes Historical Provider, MD  ciprofloxacin (CIPRO) 250 MG tablet Take 250 mg by mouth 2 (two) times daily.   Yes Historical Provider, MD  Cyanocobalamin (  VITAMIN B12 PO) Take 1 tablet by mouth daily.   Yes Historical Provider, MD  folic acid (FOLVITE) A999333 MCG tablet Take 400 mcg by mouth daily.   Yes Historical Provider, MD  levothyroxine (SYNTHROID, LEVOTHROID) 75 MCG tablet Take 75 mcg by mouth daily.     Yes Historical Provider, MD  Magnesium 250 MG TABS Take 250 mg by mouth daily.   Yes Historical Provider, MD  mirabegron ER (MYRBETRIQ) 50 MG TB24 tablet Take 50 mg by mouth daily.   Yes Historical Provider, MD  simvastatin (ZOCOR) 20 MG tablet Take 20 mg by mouth daily.    Yes Historical Provider, MD  Vitamin D, Cholecalciferol, 1000 UNITS TABS Take 1 tablet by mouth daily.   Yes Historical Provider, MD  traMADol  (ULTRAM) 50 MG tablet Take 1-2 tablets (50-100 mg total) by mouth every 6 (six) hours as needed for moderate pain. Patient not taking: Reported on 11/01/2015 10/06/15   Ardis Hughs, MD   BP 140/102 mmHg  Pulse 67  Temp(Src) 98.3 F (36.8 C) (Oral)  Resp 20  SpO2 100% Physical Exam  Constitutional: He is oriented to person, place, and time. He appears well-developed and well-nourished.  HENT:  Head: Normocephalic and atraumatic.  Eyes: EOM are normal. Pupils are equal, round, and reactive to light.  Neck: Neck supple.  Cardiovascular: Normal rate, regular rhythm, normal heart sounds and intact distal pulses.   Pulmonary/Chest: Effort normal and breath sounds normal.  Abdominal: Soft. Bowel sounds are normal. He exhibits no distension. There is no tenderness.  Musculoskeletal: Normal range of motion. He exhibits no edema or tenderness.  Neurological: He is alert and oriented to person, place, and time. He has normal strength. Coordination normal. GCS eye subscore is 4. GCS verbal subscore is 5. GCS motor subscore is 6.  Skin: Skin is warm, dry and intact.  Psychiatric: He has a normal mood and affect.     ED Course  Procedures (including critical care time) Labs Review Labs Reviewed  URINALYSIS, ROUTINE W REFLEX MICROSCOPIC (NOT AT [facility]) - Abnormal; Notable for the following:    APPearance TURBID (*)    Hgb urine dipstick LARGE (*)    Protein, ur 100 (*)    Leukocytes, UA LARGE (*)    All other components within normal limits  URINALYSIS, ROUTINE W REFLEX MICROSCOPIC (NOT AT [facility]) - Abnormal; Notable for the following:    APPearance CLOUDY (*)    Hgb urine dipstick LARGE (*)    Protein, ur 100 (*)    Leukocytes, UA LARGE (*)    All other components within normal limits  URINE MICROSCOPIC-ADD ON - Abnormal; Notable for the following:    Squamous Epithelial / LPF 0-5 (*)    Bacteria, UA MANY (*)    All other components within normal limits  URINE MICROSCOPIC-ADD ON -  Abnormal; Notable for the following:    Squamous Epithelial / LPF 0-5 (*)    Bacteria, UA MANY (*)    All other components within normal limits  CBC - Abnormal; Notable for the following:    RBC 3.16 (*)    Hemoglobin 9.7 (*)    HCT 27.0 (*)    All other components within normal limits  BASIC METABOLIC PANEL - Abnormal; Notable for the following:    Potassium 3.4 (*)    Glucose, Bld 120 (*)    BUN 29 (*)    Creatinine, Ser 2.46 (*)    GFR calc non Af Amer 24 (*)  GFR calc Af Amer 28 (*)    All other components within normal limits  URINE CULTURE    Imaging Review Dg Abd 2 Views  10/31/2015  CLINICAL DATA:  Abdominal distention. EXAM: ABDOMEN - 2 VIEW COMPARISON:  10/06/1998 17.  MRI 10/03/2015.  CT 09/29/2015. FINDINGS: Bilateral double-J ureteral stents noted with tips in stable position projected over the region of the upper bladder. Dilated loops of small large bowel noted suggesting adynamic ileus. Air-fluid levels in the colon, diarrheal illness cannot be excluded. No free air. No acute bony abnormality . IMPRESSION: 1. Bilateral double-J ureteral stents with tips in stable position projected over the region of the upper bladder. 2. Moderately dilated loops of small and large bowel suggesting adynamic ileus. Air-fluid levels in the colon, a diarrheal illness cannot be excluded. Electronically Signed   By: Marcello Moores  Register   On: 10/31/2015 13:21   I have personally reviewed and evaluated these images and lab results as part of my medical decision-making.   EKG Interpretation None     Consult: Patient's case was reviewed with Dr. Louis Meckel. He will evaluate the patient in the emergency department. MDM   Final diagnoses:  Urinary retention  UTI (lower urinary tract infection)   Patient has urinary retention. This was relieved with Foley catheter placement. At this time patient is much improved. He has had 2 days of ciprofloxacin. Vital signs have remained stable. The patient  does not have leukocytosis. Consultation has been made with Dr. Louis Meckel who has evaluated the patient. At this time he will follow-up with the patient has an outpatient to monitor his Foley catheter and stents. Patient is counseled on signs and symptoms for which return. He will also follow-up with Dr. Librarian, academic.    Charlesetta Shanks, MD 11/01/15 [phone]

## 2015-11-01 NOTE — Discharge Instructions (Signed)
Acute Urinary Retention, Male °Acute urinary retention is the temporary inability to urinate. °This is a common problem in older men. As men age their prostates become larger and block the flow of urine from the bladder. This is usually a problem that has come on gradually.  °HOME CARE INSTRUCTIONS °If you are sent home with a Foley catheter and a drainage system, you will need to discuss the best course of action with your health care provider. While the catheter is in, maintain a good intake of fluids. Keep the drainage bag emptied and lower than your catheter. This is so that contaminated urine will not flow back into your bladder, which could lead to a urinary tract infection. °There are two main types of drainage bags. One is a large bag that usually is used at night. It has a good capacity that will allow you to sleep through the night without having to empty it. The second type is called a leg bag. It has a smaller capacity, so it needs to be emptied more frequently. However, the main advantage is that it can be attached by a leg strap and can go underneath your clothing, allowing you the freedom to move about or leave your home. °Only take over-the-counter or prescription medicines for pain, discomfort, or fever as directed by your health care provider.  °SEEK MEDICAL CARE IF: °· You develop a low-grade fever. °· You experience spasms or leakage of urine with the spasms. °SEEK IMMEDIATE MEDICAL CARE IF:  °· You develop chills or fever. °· Your catheter stops draining urine. °· Your catheter falls out. °· You start to develop increased bleeding that does not respond to rest and increased fluid intake. °MAKE SURE YOU: °· Understand these instructions. °· Will watch your condition. °· Will get help right away if you are not doing well or get worse. °  °This information is not intended to replace advice given to you by your health care provider. Make sure you discuss any questions you have with your health care  provider. °  °Document Released: 08/26/2000 Document Revised: 10/04/2014 Document Reviewed: 10/29/2012 °Elsevier Interactive Patient Education ©2016 Elsevier Inc[employer] ° ° °Urinary Tract Infection °Urinary tract infections (UTIs) can develop anywhere along your urinary tract. Your urinary tract is your body's drainage system for removing wastes and extra water. Your urinary tract includes two kidneys, two ureters, a bladder, and a urethra. Your kidneys are a pair of bean-shaped organs. Each kidney is about the size of your fist. They are located below your ribs, one on each side of your spine. °CAUSES °Infections are caused by microbes, which are microscopic organisms, including fungi, viruses, and bacteria. These organisms are so small that they can only be seen through a microscope. Bacteria are the microbes that most commonly cause UTIs. °SYMPTOMS  °Symptoms of UTIs may vary by age and gender of the patient and by the location of the infection. Symptoms in young women typically include a frequent and intense urge to urinate and a painful, burning feeling in the bladder or urethra during urination. Older women and men are more likely to be tired, shaky, and weak and have muscle aches and abdominal pain. A fever may mean the infection is in your kidneys. Other symptoms of a kidney infection include pain in your back or sides below the ribs, nausea, and vomiting. °DIAGNOSIS °To diagnose a UTI, your caregiver will ask you about your symptoms. Your caregiver will also ask you to provide a urine sample. The urine   be tested for bacteria and white blood cells. White blood cells are made by your body to help fight infection. TREATMENT  Typically, UTIs can be treated with medication. Because most UTIs are caused by a bacterial infection, they usually can be treated with the use of antibiotics. The choice of antibiotic and length of treatment depend on your symptoms and the type of bacteria causing your  infection. HOME CARE INSTRUCTIONS  If you were prescribed antibiotics, take them exactly as your caregiver instructs you. Finish the medication even if you feel better after you have only taken some of the medication.  Drink enough water and fluids to keep your urine clear or pale yellow.  Avoid caffeine, tea, and carbonated beverages. They tend to irritate your bladder.  Empty your bladder often. Avoid holding urine for long periods of time.  Empty your bladder before and after sexual intercourse.  After a bowel movement, women should cleanse from front to back. Use each tissue only once. SEEK MEDICAL CARE IF:   You have back pain.  You develop a fever.  Your symptoms do not begin to resolve within 3 days. SEEK IMMEDIATE MEDICAL CARE IF:   You have severe back pain or lower abdominal pain.  You develop chills.  You have nausea or vomiting.  You have continued burning or discomfort with urination. MAKE SURE YOU:   Understand these instructions.  Will watch your condition.  Will get help right away if you are not doing well or get worse.   This information is not intended to replace advice given to you by your health care provider. Make sure you discuss any questions you have with your health care provider.   Document Released: 02/27/2005 Document Revised: 02/08/2015 Document Reviewed: 06/28/2011 Elsevier Interactive Patient Education 2016 Elsevier Inc. Urinary Tract Infection Urinary tract infections (UTIs) can develop anywhere along your urinary tract. Your urinary tract is your body's drainage system for removing wastes and extra water. Your urinary tract includes two kidneys, two ureters, a bladder, and a urethra. Your kidneys are a pair of bean-shaped organs. Each kidney is about the size of your fist. They are located below your ribs, one on each side of your spine. CAUSES Infections are caused by microbes, which are microscopic organisms, including fungi, viruses,  and bacteria. These organisms are so small that they can only be seen through a microscope. Bacteria are the microbes that most commonly cause UTIs. SYMPTOMS  Symptoms of UTIs may vary by age and gender of the patient and by the location of the infection. Symptoms in young women typically include a frequent and intense urge to urinate and a painful, burning feeling in the bladder or urethra during urination. Older women and men are more likely to be tired, shaky, and weak and have muscle aches and abdominal pain. A fever may mean the infection is in your kidneys. Other symptoms of a kidney infection include pain in your back or sides below the ribs, nausea, and vomiting. DIAGNOSIS To diagnose a UTI, your caregiver will ask you about your symptoms. Your caregiver will also ask you to provide a urine sample. The urine sample will be tested for bacteria and white blood cells. White blood cells are made by your body to help fight infection. TREATMENT  Typically, UTIs can be treated with medication. Because most UTIs are caused by a bacterial infection, they usually can be treated with the use of antibiotics. The choice of antibiotic and length of treatment depend on your symptoms  and the type of bacteria causing your infection. HOME CARE INSTRUCTIONS  If you were prescribed antibiotics, take them exactly as your caregiver instructs you. Finish the medication even if you feel better after you have only taken some of the medication.  Drink enough water and fluids to keep your urine clear or pale yellow.  Avoid caffeine, tea, and carbonated beverages. They tend to irritate your bladder.  Empty your bladder often. Avoid holding urine for long periods of time.  Empty your bladder before and after sexual intercourse.  After a bowel movement, women should cleanse from front to back. Use each tissue only once. SEEK MEDICAL CARE IF:   You have back pain.  You develop a fever.  Your symptoms do not begin  to resolve within 3 days. SEEK IMMEDIATE MEDICAL CARE IF:   You have severe back pain or lower abdominal pain.  You develop chills.  You have nausea or vomiting.  You have continued burning or discomfort with urination. MAKE SURE YOU:   Understand these instructions.  Will watch your condition.  Will get help right away if you are not doing well or get worse.   This information is not intended to replace advice given to you by your health care provider. Make sure you discuss any questions you have with your health care provider.   Document Released: 02/27/2005 Document Revised: 02/08/2015 Document Reviewed: 06/28/2011 Elsevier Interactive Patient Education [employer].

## 2015-11-01 NOTE — ED Notes (Signed)
Patient presents for fever 101.3 (500mg  tylenol 1730 today), dribbling when attempting to urinate. Recently diagnosed with UTI, taking Cipro.

## 2015-11-02 NOTE — ED Notes (Signed)
Pt reports understanding of discharge information. No questions at time of discharge 

## 2015-11-02 NOTE — Consult Note (Signed)
I have been asked to see the patient by Dr. Donata Clay, for evaluation and management of urinary retention.  History of present illness: 75 year old male, well-known to me, for bladder outlet obstruction and obstructive uropathy. The patient recently underwent bilateral ureteral stent placement for findings of hydronephrosis and worsening renal function. Because of the stent discomfort and overactive bladder symptoms we placed the patient on myrbetriq and common H without [place]. The patient began to feel fatigued was progressive voiding symptoms approximately 4 days ago. He was then seen and evaluated by his primary care provider 2 days ago and noted to have a urinary tract infection. At that time he was started on ciprofloxacin 500 mg daily. Over the course of the next 2 days his symptoms progressed. He then started developing lightheadedness and intact stability, fevers, chills, and rigors and poor urinary stream and incomplete bladder emptying. The patient was having associated anorexia and nausea. He denies any flank pain. He was subsequently advised to present to the emergency department where he was found to be in acute urinary retention. At the time of the catheter placement 1200 mL of urine was quickly drained. Once the catheter was then in the patient was able to rest he began to feel almost immediately better.  Review of systems: A 12 point comprehensive review of systems was obtained and is negative unless otherwise stated in the history of present illness.  Patient Active Problem List   Diagnosis Date Noted  . Essential hypertension 08/01/2014  . Hyperlipidemia 08/01/2014  . Obstructive sleep apnea 08/01/2014  . Chest pain 08/01/2014  . Palpitations 08/01/2014  . BMI 28.0-28.9,adult 03/06/2013  . Constipation 12/27/2010    No current facility-administered medications on file prior to encounter.   Current Outpatient Prescriptions on File Prior to Encounter  Medication Sig  Dispense Refill  . alfuzosin (UROXATRAL) 10 MG 24 hr tablet Take 10 mg by mouth daily.    Marland Kitchen amLODipine (NORVASC) 5 MG tablet Take 5 mg by mouth daily.    Marland Kitchen aspirin 81 MG tablet Take 81 mg by mouth daily.    . Cyanocobalamin (VITAMIN B12 PO) Take 1 tablet by mouth daily.    . folic acid (FOLVITE) A999333 MCG tablet Take 400 mcg by mouth daily.    Marland Kitchen levothyroxine (SYNTHROID, LEVOTHROID) 75 MCG tablet Take 75 mcg by mouth daily.      . Magnesium 250 MG TABS Take 250 mg by mouth daily.    . simvastatin (ZOCOR) 20 MG tablet Take 20 mg by mouth daily.     . Vitamin D, Cholecalciferol, 1000 UNITS TABS Take 1 tablet by mouth daily.    . traMADol (ULTRAM) 50 MG tablet Take 1-2 tablets (50-100 mg total) by mouth every 6 (six) hours as needed for moderate pain. (Patient not taking: Reported on 11/01/2015) 20 tablet 0    Past Medical History  Diagnosis Date  . Hypertension   . Hyperlipidemia   . OSA (obstructive sleep apnea) 08/08/2008    AHI 1.09/hr  . Chest pain     denies presently; myoview,holter monitor wnl; Dr Gwenlyn Found cardiologist  . Hypothyroidism   . Hydronephrosis, bilateral   . Dysuria-frequency syndrome   . Nocturia   . IBS (irritable bowel syndrome)   . Anemia   . Arthritis     osteoarthritis rt foot; gout  . Tendonitis, Achilles, right   . Wears glasses   . Eczema     Past Surgical History  Procedure Laterality Date  . Cataract extraction  x2  . Tonsillectomy  1948  . Eye surgery Bilateral     detached retina  . Mass excision Bilateral     fatty tumors rmoved from arms  . Cystoscopy w/ ureteral stent placement Bilateral 10/06/2015    Procedure: CYSTOSCOPY WITH BILATERAL RETROGRADE PYELOGRAM BILATERAL URETERAL STENT PLACEMENT WITH LEFT URETEROSCOPY;  Surgeon: Ardis Hughs, MD;  Location:  [facility];  Service: Urology;  Laterality: Bilateral;    Social History  Substance Use Topics  . Smoking status: Never Smoker   . Smokeless tobacco: Never Used  .  Alcohol Use: Yes     Comment: 1/2 beer every 2 weeks    Family History  Problem Relation Age of Onset  . Cancer Mother     Bladder  . Dementia Mother   . Cancer Father     spinal    PE: Filed Vitals:   11/01/15 1937 11/01/15 2213 11/02/15 0002  BP: 136/61 140/102 141/62  Pulse: 74 67 77  Temp: 98.3 F (36.8 C) 98.3 F (36.8 C) 97.7 F (36.5 C)  TempSrc: Oral Oral Oral  Resp: 18 20 17   SpO2: 97% 100% 100%   Patient appears to be in no acute distress  patient is alert and oriented x3 Atraumatic normocephalic head No cervical or supraclavicular lymphadenopathy appreciated No increased work of breathing, no audible wheezes/rhonchi Regular sinus rhythm/rate Abdomen is soft, nontender, nondistended, no CVA or suprapubic tenderness Lower extremities are symmetric without appreciable edema Grossly neurologically intact No identifiable skin lesions   Recent Labs  11/01/15 2320  WBC 9.2  HGB 9.7*  HCT 27.0*    Recent Labs  11/01/15 2320  NA 136  K 3.4*  CL 103  CO2 24  GLUCOSE 120*  BUN 29*  CREATININE 2.46*  CALCIUM 9.1   No results for input(s): LABPT, INR in the last 72 hours. No results for input(s): LABURIN in the last 72 hours. No results found for this or any previous visit.  Imaging: none  Imp: The patient likely has a urinary tract/prostatitis infection which had been most likely adequately treated with ciprofloxacin but his symptoms were compounded by urinary retention. At this point, his urinary retention has been treated and the patient is feeling significantly better. His electrolytes are within normal limits his creatinine is slightly worse than his baseline. He has no evidence of an elevated white blood cell count or worsening infection.  Recommendations: I recommended the patient be discharged home and scheduled for a voiding trial in our office in 5-7 days. We'll also ensure that we get the urine culture results back from his primary care  providers that we can ensure that he is on culture specific antibiotics. I encouraged the patient injured plenty of water over the interval and we will repeat his creatinine in the near future.   Louis Meckel W

## 2015-11-04 LAB — URINE CULTURE: Culture: >=100000 — AB

## 2015-11-05 ENCOUNTER — Telehealth (HOSPITAL_BASED_OUTPATIENT_CLINIC_OR_DEPARTMENT_OTHER): Payer: Self-pay

## 2015-11-05 NOTE — Telephone Encounter (Signed)
Urine culture report faxed to Dr. Louis Meckel office at [facility] urology:  Fax [phone]

## 2015-11-05 NOTE — Progress Notes (Signed)
ED Antimicrobial Stewardship Positive Culture Follow Up   Randy Ford is an 75 y.o. male who presented to [facility] on 11/01/2015 with a chief complaint of No chief complaint on file.   Recent Results (from the past 720 hour(s))  Urine culture     Status: Abnormal   Collection Time: 11/01/15  7:49 PM  Result Value Ref Range Status   Specimen Description URINE, CATHETERIZED  Final   Special Requests NONE  Final   Culture >=100,000 COLONIES/mL ENTEROCOCCUS SPECIES (A)  Final   Report Status 11/04/2015 FINAL  Final   Organism ID, Bacteria ENTEROCOCCUS SPECIES (A)  Final      Susceptibility   Enterococcus species - MIC*    AMPICILLIN <=2 SENSITIVE Sensitive     LEVOFLOXACIN 1 SENSITIVE Sensitive     NITROFURANTOIN <=16 SENSITIVE Sensitive     VANCOMYCIN 1 SENSITIVE Sensitive     * >=100,000 COLONIES/mL ENTEROCOCCUS SPECIES    75 year old male with UTI/prostatitis and urinary retention. He is continuing a course of cipro from the outpatient setting. His urine culture obtained in the ED reveals Enterococcus. It is sensitive to Levaquin.  Cipro does not cover Enterococcus as well.  He was evaluated by Dr. Louis Meckel of Urology in the ED who states in his note that Urology would like to evaluate his urine culture results and antibiotics.  Will send this result to his office for review and defer any needed antibiotic changes to Urology.   Norva Riffle 11/05/2015, 8:55 AM Infectious Diseases Pharmacist Phone# [phone]

## 2015-11-29 ENCOUNTER — Other Ambulatory Visit: Payer: Self-pay | Admitting: Urology

## 2015-11-29 NOTE — Progress Notes (Signed)
Please place orders in EPIC as patient has pre-op appointment on 12/12/2015 at 930 am! Thank you!

## 2015-12-12 ENCOUNTER — Encounter (HOSPITAL_COMMUNITY): Payer: Self-pay

## 2015-12-12 ENCOUNTER — Encounter (HOSPITAL_COMMUNITY)
Admission: RE | Admit: 2015-12-12 | Discharge: 2015-12-12 | Disposition: A | Discharge status: Home / Self Care | Payer: Medicare Other | Source: Ambulatory Visit | Attending: Urology | Admitting: Urology

## 2015-12-12 HISTORY — DX: Presence of other specified devices: Z97.8

## 2015-12-12 HISTORY — DX: Personal history of urinary (tract) infections: Z87.440

## 2015-12-12 HISTORY — DX: Pure hypercholesterolemia, unspecified: E78.00

## 2015-12-12 HISTORY — DX: Presence of urogenital implants: Z96.0

## 2015-12-12 LAB — BASIC METABOLIC PANEL
ANION GAP: 9 (ref 5–15)
BUN: 26 mg/dL — ABNORMAL HIGH (ref 6–20)
CO2: 25 mmol/L (ref 22–32)
Calcium: 9.5 mg/dL (ref 8.9–10.3)
Chloride: 106 mmol/L (ref 101–111)
Creatinine, Ser: 1.38 mg/dL — ABNORMAL HIGH (ref 0.61–1.24)
GFR, EST AFRICAN AMERICAN: 57 mL/min — AB (ref >60)
GFR, EST NON AFRICAN AMERICAN: 49 mL/min — AB (ref >60)
GLUCOSE: 80 mg/dL (ref 65–99)
POTASSIUM: 3.8 mmol/L (ref 3.5–5.1)
SODIUM: 140 mmol/L (ref 135–145)

## 2015-12-12 LAB — CBC
HEMATOCRIT: 36.1 % — AB (ref 39.0–52.0)
HEMOGLOBIN: 12.3 g/dL — AB (ref 13.0–17.0)
MCH: 30.7 pg (ref 26.0–34.0)
MCHC: 34.1 g/dL (ref 30.0–36.0)
MCV: 90 fL (ref 78.0–100.0)
Platelets: 331 10*3/uL (ref 150–400)
RBC: 4.01 MIL/uL — ABNORMAL LOW (ref 4.22–5.81)
RDW: 14.2 % (ref 11.5–15.5)
WBC: 5.8 10*3/uL (ref 4.0–10.5)

## 2015-12-12 LAB — ABO/RH: ABO/RH(D): O NEG

## 2015-12-12 NOTE — Patient Instructions (Addendum)
Gleen Nortz  12/12/2015   Your procedure is scheduled on: Friday December 15, 2015  Report to [facility] Main  Entrance take [place]  elevators to 3rd floor to  [place] at 8:30 AM.  Call this number if you have problems the morning of surgery [phone]   Remember: ONLY 1 PERSON MAY GO WITH YOU TO SHORT STAY TO GET  READY MORNING OF [place].  Do not eat food or drink liquids :After Midnight.     Take these medicines the morning of surgery with A SIP OF WATER: Amlodipine                               You may not have any metal on your body including hair pins and              piercings  Do not wear jewelry,  lotions, powders or colognes, deodorant                           Men may shave face and neck.   Do not bring valuables to the hospital. [place].  Contacts, dentures or bridgework may not be worn into surgery.  Leave suitcase in the car. After surgery it may be brought to your room.              FOLLOW SURGEON'S INSTRUCTION IN REGARDS TO BOWEL PREPARATION PRIOR TO SURGICAL DATE -- FLEETS ENEMA NIGHT PRIOR TO SURGERY               BRING CPAP MASK AND TUBING DAY OF SURGERY         _____________________________________________________________________             [facility] - Preparing for Surgery Before surgery, you can play an important role.  Because skin is not sterile, your skin needs to be as free of germs as possible.  You can reduce the number of germs on your skin by washing with CHG (chlorahexidine gluconate) soap before surgery.  CHG is an antiseptic cleaner which kills germs and bonds with the skin to continue killing germs even after washing. Please DO NOT use if you have an allergy to CHG or antibacterial soaps.  If your skin becomes reddened/irritated stop using the CHG and inform your nurse when you arrive at Short Stay. Do not shave (including legs and underarms) for at least 48  hours prior to the first CHG shower.  You may shave your face/neck. Please follow these instructions carefully:  1.  Shower with CHG Soap the night before surgery and the  morning of Surgery.  2.  If you choose to wash your hair, wash your hair first as usual with your  normal  shampoo.  3.  After you shampoo, rinse your hair and body thoroughly to remove the  shampoo.                           4.  Use CHG as you would any other liquid soap.  You can apply chg directly  to the skin and wash  Gently with a scrungie or clean washcloth.  5.  Apply the CHG Soap to your body ONLY FROM THE NECK DOWN.   Do not use on face/ open                           Wound or open sores. Avoid contact with eyes, ears mouth and genitals (private parts).                       Wash face,  Genitals (private parts) with your normal soap.             6.  Wash thoroughly, paying special attention to the area where your surgery  will be performed.  7.  Thoroughly rinse your body with warm water from the neck down.  8.  DO NOT shower/wash with your normal soap after using and rinsing off  the CHG Soap.                9.  Pat yourself dry with a clean towel.            10.  Wear clean pajamas.            11.  Place clean sheets on your bed the night of your first shower and do not  sleep with pets. Day of Surgery : Do not apply any lotions/deodorants the morning of surgery.  Please wear clean clothes to the hospital/surgery center.  FAILURE TO FOLLOW THESE INSTRUCTIONS MAY RESULT IN THE CANCELLATION OF YOUR SURGERY PATIENT SIGNATURE_________________________________  NURSE SIGNATURE__________________________________  ________________________________________________________________________

## 2015-12-13 NOTE — Progress Notes (Signed)
BMP results in epic per PAT visit 12/12/2015 sent to Dr Louis Meckel

## 2015-12-14 LAB — URINE CULTURE

## 2015-12-15 ENCOUNTER — Inpatient Hospital Stay (HOSPITAL_COMMUNITY): Payer: Medicare Other | Admitting: Registered Nurse

## 2015-12-15 ENCOUNTER — Encounter (HOSPITAL_COMMUNITY)
Admission: RE | Disposition: A | Discharge status: Home / Self Care | Payer: Self-pay | Source: Ambulatory Visit | Attending: Urology

## 2015-12-15 ENCOUNTER — Encounter (HOSPITAL_COMMUNITY): Payer: Self-pay | Admitting: *Deleted

## 2015-12-15 ENCOUNTER — Inpatient Hospital Stay (HOSPITAL_COMMUNITY)
Admission: RE | Admit: 2015-12-15 | Discharge: 2015-12-17 | DRG: 707 | Disposition: A | Discharge status: Home / Self Care | Payer: Medicare Other | Source: Ambulatory Visit | Attending: Urology | Admitting: Urology

## 2015-12-15 DIAGNOSIS — Z801 Family history of malignant neoplasm of trachea, bronchus and lung: Secondary | ICD-10-CM | POA: Diagnosis not present

## 2015-12-15 DIAGNOSIS — E785 Hyperlipidemia, unspecified: Secondary | ICD-10-CM | POA: Diagnosis not present

## 2015-12-15 DIAGNOSIS — Z8052 Family history of malignant neoplasm of bladder: Secondary | ICD-10-CM | POA: Diagnosis not present

## 2015-12-15 DIAGNOSIS — N1330 Unspecified hydronephrosis: Secondary | ICD-10-CM | POA: Diagnosis not present

## 2015-12-15 DIAGNOSIS — G4733 Obstructive sleep apnea (adult) (pediatric): Secondary | ICD-10-CM | POA: Diagnosis present

## 2015-12-15 DIAGNOSIS — N400 Enlarged prostate without lower urinary tract symptoms: Principal | ICD-10-CM | POA: Diagnosis present

## 2015-12-15 DIAGNOSIS — Z8042 Family history of malignant neoplasm of prostate: Secondary | ICD-10-CM

## 2015-12-15 DIAGNOSIS — I129 Hypertensive chronic kidney disease with stage 1 through stage 4 chronic kidney disease, or unspecified chronic kidney disease: Secondary | ICD-10-CM | POA: Diagnosis not present

## 2015-12-15 DIAGNOSIS — Z79899 Other long term (current) drug therapy: Secondary | ICD-10-CM | POA: Diagnosis not present

## 2015-12-15 DIAGNOSIS — E039 Hypothyroidism, unspecified: Secondary | ICD-10-CM | POA: Diagnosis not present

## 2015-12-15 DIAGNOSIS — R339 Retention of urine, unspecified: Secondary | ICD-10-CM | POA: Diagnosis present

## 2015-12-15 DIAGNOSIS — N320 Bladder-neck obstruction: Secondary | ICD-10-CM | POA: Diagnosis present

## 2015-12-15 HISTORY — PX: PROSTATECTOMY: SHX69

## 2015-12-15 LAB — BASIC METABOLIC PANEL
ANION GAP: 7 (ref 5–15)
BUN: 21 mg/dL — ABNORMAL HIGH (ref 6–20)
CHLORIDE: 106 mmol/L (ref 101–111)
CO2: 28 mmol/L (ref 22–32)
Calcium: 9 mg/dL (ref 8.9–10.3)
Creatinine, Ser: 1.55 mg/dL — ABNORMAL HIGH (ref 0.61–1.24)
GFR calc non Af Amer: 43 mL/min — ABNORMAL LOW (ref >60)
GFR, EST AFRICAN AMERICAN: 50 mL/min — AB (ref >60)
Glucose, Bld: 149 mg/dL — ABNORMAL HIGH (ref 65–99)
POTASSIUM: 3.4 mmol/L — AB (ref 3.5–5.1)
Sodium: 141 mmol/L (ref 135–145)

## 2015-12-15 LAB — CBC
HEMATOCRIT: 33.8 % — AB (ref 39.0–52.0)
HEMOGLOBIN: 11.2 g/dL — AB (ref 13.0–17.0)
MCH: 30.3 pg (ref 26.0–34.0)
MCHC: 33.1 g/dL (ref 30.0–36.0)
MCV: 91.4 fL (ref 78.0–100.0)
Platelets: 287 10*3/uL (ref 150–400)
RBC: 3.7 MIL/uL — ABNORMAL LOW (ref 4.22–5.81)
RDW: 14 % (ref 11.5–15.5)
WBC: 16.9 10*3/uL — AB (ref 4.0–10.5)

## 2015-12-15 LAB — TYPE AND SCREEN
ABO/RH(D): O NEG
ANTIBODY SCREEN: NEGATIVE

## 2015-12-15 SURGERY — PROSTATECTOMY
Anesthesia: General

## 2015-12-15 MED ORDER — OXYCODONE-ACETAMINOPHEN 5-325 MG PO TABS: 1.0000 | ORAL_TABLET | ORAL | Status: DC | PRN

## 2015-12-15 MED ORDER — BUPIVACAINE-EPINEPHRINE 0.25% -1:200000 IJ SOLN
INTRAMUSCULAR | Status: DC | PRN
  Administered 2015-12-15: 17 mL

## 2015-12-15 MED ORDER — EPHEDRINE SULFATE 50 MG/ML IJ SOLN
INTRAMUSCULAR | Status: AC
  Filled 2015-12-15: qty 1

## 2015-12-15 MED ORDER — OXYCODONE HCL 5 MG PO TABS: 5.0000 mg | ORAL_TABLET | ORAL | Status: DC | PRN

## 2015-12-15 MED ORDER — SENNA 8.6 MG PO TABS
1.0000 | ORAL_TABLET | Freq: Two times a day (BID) | ORAL | Status: DC
  Administered 2015-12-15 – 2015-12-17 (×4): 8.6 mg via ORAL
  Filled 2015-12-15 (×4): qty 1

## 2015-12-15 MED ORDER — DOCUSATE SODIUM 100 MG PO CAPS
100.0000 mg | ORAL_CAPSULE | Freq: Two times a day (BID) | ORAL | Status: DC
  Administered 2015-12-15 – 2015-12-17 (×4): 100 mg via ORAL
  Filled 2015-12-15 (×4): qty 1

## 2015-12-15 MED ORDER — ACETAMINOPHEN 500 MG PO TABS: 1000.0000 mg | ORAL_TABLET | Freq: Four times a day (QID) | ORAL | Status: DC

## 2015-12-15 MED ORDER — HYDRALAZINE HCL 20 MG/ML IJ SOLN: 5.0000 mg | INTRAMUSCULAR | Status: DC | PRN

## 2015-12-15 MED ORDER — LEVOTHYROXINE SODIUM 75 MCG PO TABS
75.0000 ug | ORAL_TABLET | Freq: Every day | ORAL | Status: DC
  Administered 2015-12-16 – 2015-12-17 (×2): 75 ug via ORAL
  Filled 2015-12-15 (×2): qty 1

## 2015-12-15 MED ORDER — MIDAZOLAM HCL 2 MG/2ML IJ SOLN
INTRAMUSCULAR | Status: AC
  Filled 2015-12-15: qty 2

## 2015-12-15 MED ORDER — SODIUM CHLORIDE 0.9 % IJ SOLN
INTRAMUSCULAR | Status: DC | PRN
  Administered 2015-12-15: 20 mL

## 2015-12-15 MED ORDER — ACETAMINOPHEN 10 MG/ML IV SOLN
INTRAVENOUS | Status: DC | PRN
  Administered 2015-12-15: 1000 mg via INTRAVENOUS

## 2015-12-15 MED ORDER — PROPOFOL 10 MG/ML IV BOLUS
INTRAVENOUS | Status: AC
  Filled 2015-12-15: qty 20

## 2015-12-15 MED ORDER — PROPOFOL 10 MG/ML IV BOLUS
INTRAVENOUS | Status: DC | PRN
  Administered 2015-12-15: 180 mg via INTRAVENOUS

## 2015-12-15 MED ORDER — DEXAMETHASONE SODIUM PHOSPHATE 10 MG/ML IJ SOLN
INTRAMUSCULAR | Status: AC
  Filled 2015-12-15: qty 1

## 2015-12-15 MED ORDER — LACTATED RINGERS IV SOLN
INTRAVENOUS | Status: DC
  Administered 2015-12-15 – 2015-12-16 (×3): via INTRAVENOUS

## 2015-12-15 MED ORDER — HYDROMORPHONE HCL 1 MG/ML IJ SOLN
INTRAMUSCULAR | Status: AC
  Filled 2015-12-15: qty 1

## 2015-12-15 MED ORDER — HYDROMORPHONE HCL 1 MG/ML IJ SOLN
INTRAMUSCULAR | Status: AC
Start: 2015-12-15 — End: 2015-12-16
  Filled 2015-12-15: qty 1

## 2015-12-15 MED ORDER — HYDROMORPHONE HCL 1 MG/ML IJ SOLN
0.2500 mg | INTRAMUSCULAR | Status: DC | PRN
  Administered 2015-12-15 (×4): 0.5 mg via INTRAVENOUS

## 2015-12-15 MED ORDER — BUPIVACAINE LIPOSOME 1.3 % IJ SUSP
20.0000 mL | Freq: Once | INTRAMUSCULAR | Status: AC
  Administered 2015-12-15: 20 mL
  Filled 2015-12-15: qty 20

## 2015-12-15 MED ORDER — MIDAZOLAM HCL 5 MG/5ML IJ SOLN
INTRAMUSCULAR | Status: DC | PRN
  Administered 2015-12-15: 2 mg via INTRAVENOUS

## 2015-12-15 MED ORDER — AMLODIPINE BESYLATE 5 MG PO TABS
5.0000 mg | ORAL_TABLET | Freq: Every day | ORAL | Status: DC
  Administered 2015-12-16 – 2015-12-17 (×2): 5 mg via ORAL
  Filled 2015-12-15 (×2): qty 1

## 2015-12-15 MED ORDER — OXYCODONE HCL 5 MG PO TABS
10.0000 mg | ORAL_TABLET | ORAL | Status: DC | PRN
  Administered 2015-12-16 – 2015-12-17 (×4): 10 mg via ORAL
  Filled 2015-12-15 (×5): qty 2

## 2015-12-15 MED ORDER — SODIUM CHLORIDE 0.9 % IJ SOLN
INTRAMUSCULAR | Status: AC
  Filled 2015-12-15: qty 20

## 2015-12-15 MED ORDER — SODIUM CHLORIDE 0.9 % IR SOLN
Status: DC | PRN
  Administered 2015-12-15: 2000 mL

## 2015-12-15 MED ORDER — ROCURONIUM BROMIDE 100 MG/10ML IV SOLN
INTRAVENOUS | Status: DC | PRN
  Administered 2015-12-15: 20 mg via INTRAVENOUS
  Administered 2015-12-15: 50 mg via INTRAVENOUS

## 2015-12-15 MED ORDER — CEFAZOLIN SODIUM-DEXTROSE 2-4 GM/100ML-% IV SOLN
2.0000 g | INTRAVENOUS | Status: AC
  Administered 2015-12-15: 2 g via INTRAVENOUS

## 2015-12-15 MED ORDER — LIDOCAINE HCL (CARDIAC) 20 MG/ML IV SOLN
INTRAVENOUS | Status: AC
  Filled 2015-12-15: qty 5

## 2015-12-15 MED ORDER — FENTANYL CITRATE (PF) 250 MCG/5ML IJ SOLN
INTRAMUSCULAR | Status: AC
  Filled 2015-12-15: qty 5

## 2015-12-15 MED ORDER — FENTANYL CITRATE (PF) 100 MCG/2ML IJ SOLN
INTRAMUSCULAR | Status: DC | PRN
  Administered 2015-12-15: 50 ug via INTRAVENOUS
  Administered 2015-12-15: 100 ug via INTRAVENOUS
  Administered 2015-12-15 (×2): 50 ug via INTRAVENOUS
  Administered 2015-12-15: 100 ug via INTRAVENOUS
  Administered 2015-12-15 (×2): 50 ug via INTRAVENOUS

## 2015-12-15 MED ORDER — BUPIVACAINE-EPINEPHRINE 0.25% -1:200000 IJ SOLN
INTRAMUSCULAR | Status: AC
  Filled 2015-12-15: qty 1

## 2015-12-15 MED ORDER — MAGNESIUM OXIDE 400 (241.3 MG) MG PO TABS
400.0000 mg | ORAL_TABLET | Freq: Every day | ORAL | Status: DC
Start: 2015-12-15 — End: 2015-12-17
  Administered 2015-12-16 – 2015-12-17 (×2): 400 mg via ORAL
  Filled 2015-12-15 (×3): qty 1

## 2015-12-15 MED ORDER — DEXAMETHASONE SODIUM PHOSPHATE 10 MG/ML IJ SOLN
INTRAMUSCULAR | Status: DC | PRN
  Administered 2015-12-15: 10 mg via INTRAVENOUS

## 2015-12-15 MED ORDER — ROCURONIUM BROMIDE 100 MG/10ML IV SOLN
INTRAVENOUS | Status: AC
  Filled 2015-12-15: qty 1

## 2015-12-15 MED ORDER — LACTATED RINGERS IV SOLN
INTRAVENOUS | Status: DC
  Administered 2015-12-15: 1000 mL via INTRAVENOUS
  Administered 2015-12-15 (×3): via INTRAVENOUS

## 2015-12-15 MED ORDER — FUROSEMIDE 10 MG/ML IJ SOLN
INTRAMUSCULAR | Status: DC | PRN
  Administered 2015-12-15: 40 mg via INTRAMUSCULAR

## 2015-12-15 MED ORDER — ONDANSETRON HCL 4 MG/2ML IJ SOLN
INTRAMUSCULAR | Status: DC | PRN
  Administered 2015-12-15: 4 mg via INTRAVENOUS

## 2015-12-15 MED ORDER — PROMETHAZINE HCL 25 MG/ML IJ SOLN: 6.2500 mg | INTRAMUSCULAR | Status: DC | PRN

## 2015-12-15 MED ORDER — ACETAMINOPHEN 10 MG/ML IV SOLN
1000.0000 mg | Freq: Four times a day (QID) | INTRAVENOUS | Status: AC
  Administered 2015-12-15 – 2015-12-16 (×4): 1000 mg via INTRAVENOUS
  Filled 2015-12-15 (×6): qty 100

## 2015-12-15 MED ORDER — FUROSEMIDE 10 MG/ML IJ SOLN
INTRAMUSCULAR | Status: AC
  Filled 2015-12-15: qty 4

## 2015-12-15 MED ORDER — SIMVASTATIN 20 MG PO TABS
20.0000 mg | ORAL_TABLET | Freq: Every day | ORAL | Status: DC
  Administered 2015-12-15 – 2015-12-17 (×3): 20 mg via ORAL
  Filled 2015-12-15 (×3): qty 1

## 2015-12-15 MED ORDER — LIDOCAINE 2% (20 MG/ML) 5 ML SYRINGE
INTRAMUSCULAR | Status: DC | PRN
  Administered 2015-12-15: 100 mg via INTRAVENOUS

## 2015-12-15 MED ORDER — SUGAMMADEX SODIUM 200 MG/2ML IV SOLN
INTRAVENOUS | Status: DC | PRN
  Administered 2015-12-15: 200 mg via INTRAVENOUS

## 2015-12-15 MED ORDER — BOOST / RESOURCE BREEZE PO LIQD
1.0000 | Freq: Three times a day (TID) | ORAL | Status: DC
  Administered 2015-12-16 – 2015-12-17 (×4): 1 via ORAL

## 2015-12-15 MED ORDER — CIPROFLOXACIN HCL 250 MG PO TABS
250.0000 mg | ORAL_TABLET | Freq: Two times a day (BID) | ORAL | Status: DC
  Administered 2015-12-16: 250 mg via ORAL
  Filled 2015-12-15: qty 1

## 2015-12-15 MED ORDER — CIPROFLOXACIN IN D5W 400 MG/200ML IV SOLN
400.0000 mg | INTRAVENOUS | Status: AC
  Administered 2015-12-15: 400 mg via INTRAVENOUS

## 2015-12-15 MED ORDER — SODIUM CHLORIDE 0.9 % IJ SOLN
INTRAMUSCULAR | Status: AC
  Filled 2015-12-15: qty 10

## 2015-12-15 MED ORDER — FLEET ENEMA 7-19 GM/118ML RE ENEM: 1.0000 | ENEMA | Freq: Once | RECTAL | Status: DC

## 2015-12-15 MED ORDER — HYDROMORPHONE HCL 1 MG/ML IJ SOLN
0.5000 mg | INTRAMUSCULAR | Status: DC | PRN
  Administered 2015-12-15: 1 mg via INTRAVENOUS
  Administered 2015-12-16: 0.5 mg via INTRAVENOUS
  Administered 2015-12-16 (×2): 1 mg via INTRAVENOUS
  Filled 2015-12-15 (×4): qty 1

## 2015-12-15 MED ORDER — ONDANSETRON HCL 4 MG/2ML IJ SOLN: 4.0000 mg | INTRAMUSCULAR | Status: DC | PRN

## 2015-12-15 MED ORDER — ONDANSETRON HCL 4 MG/2ML IJ SOLN
INTRAMUSCULAR | Status: AC
Start: 2015-12-15 — End: 2015-12-15
  Filled 2015-12-15: qty 2

## 2015-12-15 MED ORDER — SUGAMMADEX SODIUM 200 MG/2ML IV SOLN
INTRAVENOUS | Status: AC
  Filled 2015-12-15: qty 2

## 2015-12-15 SURGICAL SUPPLY — 67 items
AGENT HMST MTR 8 SURGIFLO (HEMOSTASIS)
BAG URINE DRAINAGE (UROLOGICAL SUPPLIES) ×3 IMPLANT
BLADE EXTENDED COATED 6.5IN (ELECTRODE) ×3 IMPLANT
BLADE HEX COATED 2.75 (ELECTRODE) ×3 IMPLANT
BLADE SURG SZ12 CARB STEEL (BLADE) ×3 IMPLANT
CATH FOLEY 2WAY SLVR 30CC 22FR (CATHETERS) ×2 IMPLANT
CATH FOLEY 2WAY SLVR 30CC 24FR (CATHETERS) ×1 IMPLANT
CATH FOLEY 3WAY 30CC 24FR (CATHETERS)
CATH URTH STD 24FR FL 3W 2 (CATHETERS) ×1 IMPLANT
CLIP LIGATING HEM O LOK PURPLE (MISCELLANEOUS) ×2 IMPLANT
CLIP LIGATING HEMO O LOK GREEN (MISCELLANEOUS) ×1 IMPLANT
COVER SURGICAL LIGHT HANDLE (MISCELLANEOUS) ×3 IMPLANT
DECANTER SPIKE VIAL GLASS SM (MISCELLANEOUS) ×2 IMPLANT
DRAIN CHANNEL 10F 3/8 F FF (DRAIN) IMPLANT
DRAIN CHANNEL RND F F (WOUND CARE) ×2 IMPLANT
DRAPE LAPAROSCOPIC ABDOMINAL (DRAPES) ×3 IMPLANT
DRAPE LAPAROTOMY T 102X78X121 (DRAPES) ×3 IMPLANT
DRAPE UTILITY 15X26 (DRAPE) ×3 IMPLANT
DRAPE WARM FLUID 44X44 (DRAPE) ×3 IMPLANT
DRSG TEGADERM 4X4.75 (GAUZE/BANDAGES/DRESSINGS) ×4 IMPLANT
ELECT REM PT RETURN 9FT ADLT (ELECTROSURGICAL) ×3
ELECTRODE REM PT RTRN 9FT ADLT (ELECTROSURGICAL) ×1 IMPLANT
EVACUATOR SILICONE 100CC (DRAIN) IMPLANT
GAUZE SPONGE 2X2 8PLY STRL LF (GAUZE/BANDAGES/DRESSINGS) IMPLANT
GAUZE SPONGE 4X4 16PLY XRAY LF (GAUZE/BANDAGES/DRESSINGS) ×3 IMPLANT
GLOVE BIOGEL M STRL SZ7.5 (GLOVE) ×3 IMPLANT
GOWN STRL REUS W/TWL XL LVL3 (GOWN DISPOSABLE) ×3 IMPLANT
GUIDEWIRE STR DUAL SENSOR (WIRE) ×1 IMPLANT
KIT BASIN OR (CUSTOM PROCEDURE TRAY) ×3 IMPLANT
LIQUID BAND (GAUZE/BANDAGES/DRESSINGS) ×4 IMPLANT
NEEDLE HYPO 22GX1.5 SAFETY (NEEDLE) ×3 IMPLANT
NS IRRIG 1000ML POUR BTL (IV SOLUTION) ×6 IMPLANT
PACK GENERAL/GYN (CUSTOM PROCEDURE TRAY) ×3 IMPLANT
PLUG CATH AND CAP STER (CATHETERS) ×4 IMPLANT
SCRUB TECHNI CARE 4 OZ NO DYE (MISCELLANEOUS) ×1 IMPLANT
SET IRRIG Y TYPE TUR BLADDER L (SET/KITS/TRAYS/PACK) IMPLANT
SPOGE SURGIFLO 8M (HEMOSTASIS)
SPONGE GAUZE 2X2 STER 10/PKG (GAUZE/BANDAGES/DRESSINGS) ×2
SPONGE LAP 18X18 X RAY DECT (DISPOSABLE) ×3 IMPLANT
SPONGE LAP 4X18 X RAY DECT (DISPOSABLE) ×3 IMPLANT
SPONGE SURGIFLO 8M (HEMOSTASIS) IMPLANT
STAPLER VISISTAT 35W (STAPLE) IMPLANT
SURGIFLO W/THROMBIN 8M KIT (HEMOSTASIS) ×1 IMPLANT
SUT ETHILON 3 0 PS 1 (SUTURE) ×4 IMPLANT
SUT MNCRL AB 4-0 PS2 18 (SUTURE) ×2 IMPLANT
SUT PDS AB 1 CTX 36 (SUTURE) ×6 IMPLANT
SUT PDS AB 1 TP1 96 (SUTURE) ×1 IMPLANT
SUT SILK 0 (SUTURE) ×3
SUT SILK 0 30XBRD TIE 6 (SUTURE) ×1 IMPLANT
SUT SILK 2 0 (SUTURE) ×3
SUT SILK 2-0 30XBRD TIE 12 (SUTURE) IMPLANT
SUT VIC AB 0 CT1 27 (SUTURE) ×3
SUT VIC AB 0 CT1 27XBRD ANTBC (SUTURE) ×1 IMPLANT
SUT VIC AB 0 UR5 27 (SUTURE) ×4 IMPLANT
SUT VIC AB 2-0 SH 27 (SUTURE) ×15
SUT VIC AB 2-0 SH 27X BRD (SUTURE) ×1 IMPLANT
SUT VIC AB 2-0 UR5 27 (SUTURE) IMPLANT
SUT VIC AB 2-0 UR6 27 (SUTURE) IMPLANT
SUT VIC AB 3-0 SH 27 (SUTURE) ×6
SUT VIC AB 3-0 SH 27XBRD (SUTURE) IMPLANT
SUT VICRYL 0 27 CT2 27 ABS (SUTURE) ×12 IMPLANT
SYR 30ML LL (SYRINGE) ×3 IMPLANT
SYR CONTROL 10ML LL (SYRINGE) ×3 IMPLANT
TOWEL OR 17X26 10 PK STRL BLUE (TOWEL DISPOSABLE) ×6 IMPLANT
TOWEL OR NON WOVEN STRL DISP B (DISPOSABLE) ×3 IMPLANT
WATER STERILE IRR 1500ML POUR (IV SOLUTION) ×1 IMPLANT
YANKAUER SUCT BULB TIP NO VENT (SUCTIONS) ×2 IMPLANT

## 2015-12-15 NOTE — Anesthesia Postprocedure Evaluation (Signed)
Anesthesia Post Note  Patient: Randy Ford  Procedure(s) Performed: Procedure(s) (LRB): OPEN SIMPLE PROSTATECTOMY (N/A)  Patient location during evaluation: PACU Anesthesia Type: General Level of consciousness: sedated Pain management: pain level controlled Vital Signs Assessment: post-procedure vital signs reviewed and stable Respiratory status: spontaneous breathing Cardiovascular status: stable Postop Assessment: no signs of nausea or vomiting Anesthetic complications: no     Last Vitals:  Filed Vitals:   12/15/15 1500 12/15/15 1505  BP: 152/73   Pulse: 72 62  Temp:    Resp: 5 8    Last Pain:  Filed Vitals:   12/15/15 1507  PainSc: 5    Pain Goal: Patients Stated Pain Goal: 3 (12/15/15 1030)               Novaleigh Kohlman JR,JOHN Evangaline Jou

## 2015-12-15 NOTE — Progress Notes (Signed)
Day of Surgery Post Op Check  Subjective: The patient is doing well.  No nausea or vomiting. Pain is adequately controlled. Minimal drain output.   Objective: Vital signs in last 24 hours: Temp:  [98.2 F (36.8 C)-98.4 F (36.9 C)] 98.3 F (36.8 C) (07/14 1535) Pulse Rate:  [62-78] 67 (07/14 1535) Resp:  [5-18] 14 (07/14 1535) BP: (146-177)/(60-80) 160/60 mmHg (07/14 1535) SpO2:  [99 %-100 %] 100 % (07/14 1535) Weight:  [87.091 kg (192 lb)] 87.091 kg (192 lb) (07/14 0857)  Intake/Output from previous day:   Intake/Output this shift: Total I/O In: 2400 [I.V.:2400] Out: 2680 [Urine:2250; Drains:30; Blood:400]  Physical Exam:  General: Alert and oriented. CV: RRR Lungs: Clear bilaterally. GI: Soft, Nondistended. Drain with SS fluid Incisions: Clean, dry, and intact Urine: watermelon colored urine but thin without clot Extremities: Nontender, no erythema, no edema.  Lab Results:  Recent Labs  12/15/15 1558  HGB 11.2*  HCT 33.8*      Assessment/Plan: POD# 1 s/p open simple prostatectomy.  1) Continue maintenance IV fluids 2) Flush foley PRN for poor drainage. If urine becomes too bloody, consider CBI 3) Labs in AM 4) Will have him ambulate in AM 5) Clear liquids today, advance diet tomorrow AM    LOS: 0 days   Lolita Rieger 12/15/2015, 5:00 PM

## 2015-12-15 NOTE — Anesthesia Preprocedure Evaluation (Addendum)
Anesthesia Evaluation  Patient identified by MRN, date of birth, ID band Patient awake    Reviewed: Allergy & Precautions, NPO status , Patient's Chart, lab work & pertinent test results  History of Anesthesia Complications Negative for: history of anesthetic complications  Airway Mallampati: I  TM Distance: >3 FB Neck ROM: Full    Dental  (+) Teeth Intact, Dental Advisory Given   Pulmonary sleep apnea ,    breath sounds clear to auscultation       Cardiovascular hypertension, Pt. on medications  Rhythm:Regular Rate:Normal     Neuro/Psych negative neurological ROS  negative psych ROS   GI/Hepatic negative GI ROS, Neg liver ROS,   Endo/Other  Hypothyroidism   Renal/GU Renal disease  negative genitourinary   Musculoskeletal  (+) Arthritis ,   Abdominal   Peds negative pediatric ROS (+)  Hematology   Anesthesia Other Findings - HLD - IBS  Reproductive/Obstetrics                            08/2015 EKG: normal sinus rhythm.   Anesthesia Physical  Anesthesia Plan  ASA: II  Anesthesia Plan: General   Post-op Pain Management:    Induction: Intravenous  Airway Management Planned: Oral ETT  Additional Equipment:   Intra-op Plan:   Post-operative Plan: Extubation in OR  Informed Consent: I have reviewed the patients History and Physical, chart, labs and discussed the procedure including the risks, benefits and alternatives for the proposed anesthesia with the patient or authorized representative who has indicated his/her understanding and acceptance.   Dental advisory given  Plan Discussed with: CRNA, Anesthesiologist and Surgeon  Anesthesia Plan Comments:        Anesthesia Quick Evaluation

## 2015-12-15 NOTE — Anesthesia Procedure Notes (Signed)
Procedure Name: Intubation Date/Time: 12/15/2015 11:36 AM Performed by: Noralyn Pick D Pre-anesthesia Checklist: Patient identified, Emergency Drugs available, Suction available and Patient being monitored Patient Re-evaluated:Patient Re-evaluated prior to inductionOxygen Delivery Method: Circle system utilized Preoxygenation: Pre-oxygenation with 100% oxygen Intubation Type: IV induction Ventilation: Mask ventilation without difficulty Laryngoscope Size: Mac and 4 Tube type: Oral Tube size: 7.0 mm Number of attempts: 1 Airway Equipment and Method: Stylet Placement Confirmation: ETT inserted through vocal cords under direct vision,  positive ETCO2 and breath sounds checked- equal and bilateral Secured at: 22 cm Tube secured with: Tape Dental Injury: Teeth and Oropharynx as per pre-operative assessment

## 2015-12-15 NOTE — Transfer of Care (Signed)
Immediate Anesthesia Transfer of Care Note  Patient: Randy Ford  Procedure(s) Performed: Procedure(s): OPEN SIMPLE PROSTATECTOMY (N/A)  Patient Location: PACU  Anesthesia Type:General  Level of Consciousness:  sedated, patient cooperative and responds to stimulation  Airway & Oxygen Therapy:Patient Spontanous Breathing and Patient connected to face mask oxgen  Post-op Assessment:  Report given to PACU RN and Post -op Vital signs reviewed and stable  Post vital signs:  Reviewed and stable  Last Vitals:  Filed Vitals:   12/15/15 0840 12/15/15 0856  BP: 177/80 155/78  Pulse: 72   Temp: 36.8 C   Resp: 18     Complications: No apparent anesthesia complications

## 2015-12-15 NOTE — H&P (Signed)
Randy Ford is a 75 year-old male established patient who is here for urinary retention.  His problem was diagnosed 10/16/2015. He currently has an indwelling catheter. The catheter has been in his bladder for approximately 10/24/2015. His urinary retention is being treated with foley catheter. Patient denies suprapubic tube, intemittent catheterization, flomax, hytrin, cardura, uroxatrol, rapaflo, avodart, and proscar.   He does not have an abnormal sensation when needing to urinate. He does not have to strain or bear down to start his urinary stream. He is having problems with emptying his bladder well. His urine has shut off completely.   He is not having problems with urinary control or incontinence.   He has previously had an indwelling catheter in for more than two weeks at a time.     CC/HPI: Failed two voiding trials. Presents today for further management of his retention. His infections appear to have cleared. He is feeling better, still wiped out with low energy.     ALLERGIES: No Allergies No Known Drug Allergies    MEDICATIONS: Alfuzosin HCl ER 10 MG Oral Tablet Extended Release 24 Hour 0 Oral  AmLODIPine Besylate TABS Oral  Amoxicillin-Pot Clavulanate 875-125 MG Oral Tablet 0 Oral  Aspirin 81 MG TABS Oral  Cipro 250 MG Oral Tablet Oral  Levoxyl 75 MCG Oral Tablet Oral  Simvastatin TABS Oral  Tylenol CAPS Oral     GU PSH: Cystoscopy Insert Stent - 10/18/2015 Cystoscopy Ureteroscopy - 10/18/2015      PSH Notes: Cystoscopy With Insertion Of Ureteral Stent Bilateral, Cystoscopy With Ureteroscopy Right, Eye Surgery, Arm Incision, Tonsillectomy   NON-GU PSH: Remove Tonsils - 2007          GU PMH: BPH w/LUTS - 11/06/2015, BPH w/LUTS, Benign prostatic hyperplasia with urinary obstruction - 10/27/2015 Hydronephrosis Unspec (Stable) - 11/06/2015, Bilateral hydronephrosis, - 10/27/2015 Urinary Tract Inf, Unspec site, Urinary tract infection - 11/03/2015, Suspected urinary tract  infection, - 08/25/2015 Nocturia, Nocturia - 10/27/2015 Elevated PSA, Elevated prostate specific antigen (PSA) - 09/22/2015 Other microscopic hematuria, Microscopic hematuria - 12/28/2013 Epididymitis, Epididymitis - 2014 Hematuria, Unspec, Hematuria - 2014 Splitting of Stream, Intermittent urinary stream - 2014 Urgency of urination, Urinary urgency - 2014       PMH Notes: Previously, the patient had seen [name], he had five negative prostate biopsies in the past for elevated PSA. His PSA has been around 7.8 and 8.0 for the past year and has been as high as 14 in November 2011. His PSA was also stable at around 10.17. He had a negative hematuria work-up in April 2000.   Treated for prostatitis several times.      NON-GU PMH: Other specified abnormal findings of blood chemistry, Elevated serum creatinine - 10/06/2015 Encounter for general adult medical examination without abnormal findings, Encounter for preventive health examination - 09/22/2015 Personal history of other diseases of the circulatory system, History of hypertension - 2014 Personal history of other diseases of the nervous system and sense organs, History of sleep apnea - 2014 Personal history of other endocrine, nutritional and metabolic disease, History of hypothyroidism - 2014, History of hypercholesterolemia, - 2014 Personal history of other specified conditions, History of heartburn - 2014 , Epididymis Mass (___cm) - 2011, Urinary Frequency, - 2007     FAMILY HISTORY: Alzheimer's Disease - Mother, Uncle Bladder Cancer - Mother Death In The Family Father - Runs In Family Death In The Family Mother - Runs In Family Lung Cancer - Father Prostate Cancer - Uncle  SOCIAL HISTORY: Marital Status: Married Patient has never smoked.  Does not use smokeless tobacco. Drinks 1 drink per week. Types of alcohol consumed: Beer. Social Drinker.  Does not use drugs. Has not had a blood transfusion.      Notes: Never A Smoker,  Occupation: Retired, Marital History - Currently Married, Alcohol Use, Caffeine Use    REVIEW OF SYSTEMS:      GU Review Male:  Patient reports get up at night to urinate and trouble starting your stream. Patient denies frequent urination, hard to postpone urination, burning/ pain with urination, leakage of urine, stream starts and stops, have to strain to urinate , erection problems, and penile pain.     Gastrointestinal (Upper):  denies. Patient denies nausea, vomiting, and indigestion/ heartburn.     Gastrointestinal (Lower):  Patient reports diarrhea. Patient denies constipation.     Constitutional:  Patient reports weight loss. Patient denies fever, night sweats, and fatigue.     Skin:  Patient denies skin rash/ lesion and itching.     Eyes:  Patient denies blurred vision and double vision.     Ears/ Nose/ Throat:  Patient denies sinus problems and sore throat.     Hematologic/Lymphatic:  Patient denies swollen glands and easy bruising.     Cardiovascular:  Patient denies leg swelling and chest pains.     Respiratory:  Patient denies cough and shortness of breath.     Endocrine:  Patient denies excessive thirst.     Musculoskeletal:  Patient denies back pain and joint pain.     Neurological:  Patient denies headaches and dizziness.     Psychologic:  Patient denies depression and anxiety.     VITAL SIGNS:       Weight: 186 lb/84.4 kg       Height/Length: 69 in / 175 cm       BP: 115/69 mmHg       Pulse: 82 /min       BMI: 27.5        PAST DATA REVIEWED:   Source Of History:  Patient  Lab Test Review:  PSA  Records Review:  Previous Doctor Records, Previous Patient Records  Urine Test Review:  Urinalysis  X-Ray Review: TRUSP/BX: Reviewed Films.  MRI Prostate WL: Reviewed Films. Reviewed Report.     PROCEDURES: None   ASSESSMENT:     ICD-10 Details  1 GU:  Urinary Retention, Unspec - R33.9   2  Hydronephrosis Unspec - N13.30 Stable   PLAN:   Orders  Labs Urine Culture and  Sensitivity   Lab Notes: please fax results to [phone]  Schedule  X-Rays: 1 Week - Prostate Ultrasound  Return Visit: 1 Month - Schedule Surgery  Document  Letter(s):  Created for Patient: Clinical Summary   Notes:  Patient has failed two voiding trials over the last several weeks. His prostate has been an issue for him for the last 10-15 years causing prostatitis and voiding symptoms. More recently he's had elevated creatinine and hydronephrosis. This is likely from bladder outlet obstruction. We discussed treatment options for this patient including TURP, laser prostate ablation and open/lap simple prostatectomy. We discussed the pros/cons of all three of these procedures. I initially was leaning towards simple prostatectomy, but reviewed his MRI and previous TRUS demonstrating a large obstructing median lobe and not huge lateral lobes. He may be well suited for a bipolar TURP. I have ordered a repeat TRUS since the last one was in 2006 to  get an accurate prostate volume and configuration after which we will make a final decision as to how to proceed. I will contact patient with the results of the TRUS and to discuss his surgical options again.

## 2015-12-16 LAB — CBC
HEMATOCRIT: 29.8 % — AB (ref 39.0–52.0)
Hemoglobin: 10.3 g/dL — ABNORMAL LOW (ref 13.0–17.0)
MCH: 30.9 pg (ref 26.0–34.0)
MCHC: 34.6 g/dL (ref 30.0–36.0)
MCV: 89.5 fL (ref 78.0–100.0)
Platelets: 279 10*3/uL (ref 150–400)
RBC: 3.33 MIL/uL — ABNORMAL LOW (ref 4.22–5.81)
RDW: 14 % (ref 11.5–15.5)
WBC: 17.2 10*3/uL — ABNORMAL HIGH (ref 4.0–10.5)

## 2015-12-16 LAB — GLUCOSE, CAPILLARY: Glucose-Capillary: 117 mg/dL — ABNORMAL HIGH (ref 65–99)

## 2015-12-16 MED ORDER — CIPROFLOXACIN HCL 500 MG PO TABS
500.0000 mg | ORAL_TABLET | Freq: Two times a day (BID) | ORAL | Status: DC
  Administered 2015-12-16 – 2015-12-17 (×2): 500 mg via ORAL
  Filled 2015-12-16 (×2): qty 1

## 2015-12-16 MED ORDER — BELLADONNA ALKALOIDS-OPIUM 16.2-60 MG RE SUPP
1.0000 | Freq: Four times a day (QID) | RECTAL | Status: DC | PRN
  Administered 2015-12-16: 1 via RECTAL
  Filled 2015-12-16: qty 1

## 2015-12-16 NOTE — Progress Notes (Addendum)
Urology Inpatient Progress Report BLADDER OUTLET OBSTRUCTION,BILATERAL HYDRONEPHROSIS Procedure(s): OPEN SIMPLE PROSTATECTOMY 1 Day Post-Op  Intv/Subj: No acute events overnight. Patient is without complaint.  Active Problems:   BPH (benign prostatic hyperplasia)  Current Facility-Administered Medications  Medication Dose Route Frequency Provider Last Rate Last Dose  . acetaminophen (OFIRMEV) IV 1,000 mg  1,000 mg Intravenous Q6H Ardis Hughs, MD   1,000 mg at 12/16/15 [id number]  . amLODipine (NORVASC) tablet 5 mg  5 mg Oral Daily Lolita Rieger, MD      . ciprofloxacin (CIPRO) tablet 500 mg  500 mg Oral BID Ardis Hughs, MD      . docusate sodium (COLACE) capsule 100 mg  100 mg Oral BID Lolita Rieger, MD   100 mg at 12/15/15 2213  . feeding supplement (BOOST / RESOURCE BREEZE) liquid 1 Container  1 Container Oral TID BM Ardis Hughs, MD   1 Container at 12/15/15 2000  . hydrALAZINE (APRESOLINE) injection 5 mg  5 mg Intravenous Q4H PRN Ardis Hughs, MD      . HYDROmorphone (DILAUDID) injection 0.5-1 mg  0.5-1 mg Intravenous Q3H PRN Lolita Rieger, MD   1 mg at 12/16/15 0618  . lactated ringers infusion   Intravenous Continuous Lolita Rieger, MD 125 mL/hr at 12/16/15 [phone]    . levothyroxine (SYNTHROID, LEVOTHROID) tablet 75 mcg  75 mcg Oral QAC breakfast Lolita Rieger, MD   75 mcg at 12/16/15 0746  . magnesium oxide (MAG-OX) tablet 400 mg  400 mg Oral Daily Lolita Rieger, MD   400 mg at 12/15/15 2200  . ondansetron (ZOFRAN) injection 4 mg  4 mg Intravenous Q4H PRN Lolita Rieger, MD      . oxyCODONE (Oxy IR/ROXICODONE) immediate release tablet 5 mg  5 mg Oral Q4H PRN Lolita Rieger, MD       Or  . oxyCODONE (Oxy IR/ROXICODONE) immediate release tablet 10 mg  10 mg Oral Q4H PRN Lolita Rieger, MD      . senna [facility]) tablet 8.6 mg  1 tablet Oral BID Lolita Rieger, MD   8.6 mg at 12/15/15 2213  . simvastatin (ZOCOR) tablet 20 mg  20 mg Oral Daily Lolita Rieger, MD   20 mg at 12/15/15  2213     Objective: Vital: Filed Vitals:   12/15/15 1535 12/15/15 2123 12/16/15 0224 12/16/15 0609  BP: 160/60 139/67 141/67 135/58  Pulse: 67 75 59 64  Temp: 98.3 F (36.8 C) 97.7 F (36.5 C) 97.6 F (36.4 C) 98 F (36.7 C)  TempSrc:  Oral Oral Oral  Resp: 14 16 16 16   Height:      Weight:      SpO2: 100% 99% 99% 100%   I/Os: I/O last 3 completed shifts: In: 7942.5 [P.O.:480; I.V.:4262.5; Other:3000; IV Piggyback:200] Out: 10010 [Urine:9500; Drains:110; Blood:400]  Physical Exam:  General: Patient is in no apparent distress Lungs: Normal respiratory effort, chest expands symmetrically. GI: Incision d/c/i, drain serosang Foley strawberry lemonade colored on slow CBI Ext: lower extremities symmetric  Lab Results:  Recent Labs  12/15/15 1558 12/16/15 0502  WBC 16.9* 17.2*  HGB 11.2* 10.3*  HCT 33.8* 29.8*    Recent Labs  12/15/15 1558  NA 141  K 3.4*  CL 106  CO2 28  GLUCOSE 149*  BUN 21*  CREATININE 1.55*  CALCIUM 9.0   No results for input(s): LABPT, INR in the last 72 hours. No results for input(s):  LABURIN in the last 72 hours. Results for orders placed or performed during the hospital encounter of 12/12/15  Urine culture     Status: Abnormal   Collection Time: 12/12/15  9:35 AM  Result Value Ref Range Status   Specimen Description URINE, CLEAN CATCH  Final   Special Requests NONE  Final   Culture >=100,000 COLONIES/mL KLEBSIELLA PNEUMONIAE (A)  Final   Report Status 12/14/2015 FINAL  Final   Organism ID, Bacteria KLEBSIELLA PNEUMONIAE (A)  Final      Susceptibility   Klebsiella pneumoniae - MIC*    AMPICILLIN >=32 RESISTANT Resistant     CEFAZOLIN <=4 SENSITIVE Sensitive     CEFTRIAXONE <=1 SENSITIVE Sensitive     CIPROFLOXACIN <=0.25 SENSITIVE Sensitive     GENTAMICIN <=1 SENSITIVE Sensitive     IMIPENEM <=0.25 SENSITIVE Sensitive     NITROFURANTOIN 64 INTERMEDIATE Intermediate     TRIMETH/SULFA <=20 SENSITIVE Sensitive      AMPICILLIN/SULBACTAM 4 SENSITIVE Sensitive     PIP/TAZO <=4 SENSITIVE Sensitive     * >=100,000 COLONIES/mL KLEBSIELLA PNEUMONIAE    Studies/Results: No results found.  Assessment: Procedure(s): OPEN SIMPLE PROSTATECTOMY, 1 Day Post-Op  Plan: HLIVF Oral pain meds D/c CBI UP to chair/ambulate D/c tomorrow.   Louis Meckel, MD Urology 12/16/2015, 10:00 AM

## 2015-12-16 NOTE — Op Note (Signed)
Preoperative diagnosis:  1. Bladder outlet obstruction 2. Chronic renal insufficiency  Postoperative diagnosis:  1. same   Procedure: 1. Open suprapubic simple prostatectomy  Surgeon: Ardis Hughs, MD Resident assistant: Dr. Virginia Crews, MD  Anesthesia: General  Complications: None  Intraoperative findings: large median lobe, bilateral ureteral stents removed.  EBL: 400cc  Specimens: enucleated prostate  Indication: Randy Ford is a 75 y.o. patient with bladder outlet obstruction secondary to enlarged prostate.  He also had some hydronephrosis and renal insufficiency presumably from the obstructing prostate and associated urinary retention.  After reviewing the management options for treatment, he elected to proceed with the above surgical procedure(s). We have discussed the potential benefits and risks of the procedure, side effects of the proposed treatment, the likelihood of the patient achieving the goals of the procedure, and any potential problems that might occur during the procedure or recuperation. Informed consent has been obtained.  Description of procedure:  The patient was taken to the operating room and general anesthesia was induced.  The patient was placed in supine position, prepped and draped in the usual sterile fashion, and preoperative antibiotics were administered. A preoperative time-out was performed.   A lower midline incision was made and carried down to the rectus fascia.  The fascia was opened and the urachal remnant was divided.  The space of retzius was developed bluntly.  A Bookwalter retractor was placed and the edges of the wound retracted.  The prostate capsule was identified and a 0-Vicryl figure of 8 stitch was placed at the 4 and 7 o'clock position in an attempt to curb some bleeding during the dissection.  A 55F 3-way foley was placed and the bladder filled with ~250cc of normal saline.  2-0 vicryls x 4  were placed into the bladder on the  anterior surface and snapped, used for holding stitches.  A vertical midline incision was then made into the bladder, which was opened from the bladder neck to the bladder dome.  The ureteral stents were removed by grasping them and pulling.  The dome of the bladder was packed with a lap and retracted superiorly.  The lateral walls of the bladder were also packed and the retractors repositioned.  The foley catheter was then removed.  The bladder mucosa was then incised with a bovie at the bladder neck and the plane between the prostate adenoma and the capsule was then entered with blunt digital dissection.  Using finger dissection the prostate was enucleated circumferentially.  The prostate was then removed and passed off as a specimen.  The prostatic fossa was then packed and left for ~5 minutes.  There was a minor amount of bleeding/oozing at this point and most of the bleeding controlled.  Using 2-0 Vicryls the posterior aspect of the bladder neck was tacked to the prostatic capsule posteriorly.  The foley was then reinserted and the balloon inflated to 45cc.  The bladder mucosal layer was closed with 3-0 vicryl in a running fashion.  The detrusor and overlaying layer was closed with a 2-0 Vicryl in a similar fashion.  A 19F blake drain was then placed in the left lower quadrant and positioned along the anterior bladder.  The lower rectus muscle was then reapproximated with 2-0 Vicyrl in interrupted stitches.  The rectus fascia was then closed with 1-0 PDS starting at the edges and coming into the middle of the incision where it was tied off.  The wound was then copious irrigated.  Several deep dermal stitches were  placed to help reapproximate the incision using 2-0 Vicyrl.  40cc of Exparel was injected into the incision.  The skin was then closed with 4-0 monocyrl in subcuticular fashion.  Dermabound was applied.  All laps, needles, and sponges were accounted for at the end of the case.  The patient was  subsequently awoken, extubated and taken to the PACU in stable condition.  Ardis Hughs, M.D.

## 2015-12-17 MED ORDER — BISACODYL 10 MG RE SUPP: 10.0000 mg | Freq: Every day | RECTAL | Status: DC | PRN

## 2015-12-17 MED ORDER — BISACODYL 10 MG RE SUPP
10.0000 mg | Freq: Once | RECTAL | Status: AC
  Administered 2015-12-17: 10 mg via RECTAL
  Filled 2015-12-17: qty 1

## 2015-12-17 MED ORDER — DOCUSATE SODIUM 100 MG PO CAPS: 100.0000 mg | ORAL_CAPSULE | Freq: Two times a day (BID) | ORAL | Status: DC | PRN

## 2015-12-17 NOTE — Discharge Summary (Signed)
Date of admission: 12/15/2015  Date of discharge: 12/17/2015  Admission diagnosis:  1) bladder outlet obstruction 2) urinary retention 3) chronic renal insufficiency  Discharge diagnosis: same, s/p open simple suprapubic prostatectomy and bilateral ureteral stent removal  Secondary diagnoses:  Patient Active Problem List   Diagnosis Date Noted  . BPH (benign prostatic hyperplasia) 12/15/2015  . Essential hypertension 08/01/2014  . Hyperlipidemia 08/01/2014  . Obstructive sleep apnea 08/01/2014  . Chest pain 08/01/2014  . Palpitations 08/01/2014  . BMI 28.0-28.9,adult 03/06/2013  . Constipation 12/27/2010    History and Physical: For full details, please see admission history and physical. Briefly, Randy Ford is a 75 y.o. year old patient with Urinary retention secondary to bladder outlet obstruction. He also had associated chronic renal insufficiency. This was presumed to be because of his obstruction in combination with his long-standing hypertension. He presented to the preoperative area for the elective surgery.   Hospital Course: Patient tolerated the procedure well.  He was then transferred to the floor after an uneventful PACU stay.  His hospital course was uncomplicated.  On POD#2 he had met discharge criteria: was eating a regular diet, was up and ambulating independently,  pain was well controlled,  and was ready to for discharge.  Exam on day of discharge: NAD Filed Vitals:   12/16/15 2137 12/17/15 0637  BP: 141/54 154/60  Pulse: 73 88  Temp: 98 F (36.7 C) 98.7 F (37.1 C)  Resp: 18 18    Intake/Output Summary (Last 24 hours) at 12/17/15 0928 Last data filed at 12/17/15 5929  Gross per 24 hour  Intake   1340 ml  Output   2210 ml  Net   -870 ml  Non-labored breathing Abdomen is soft Incision is c/d/i Foley draining light red urine   Laboratory values:   Recent Labs  12/15/15 1558 12/16/15 0502  WBC 16.9* 17.2*  HGB 11.2* 10.3*  HCT 33.8* 29.8*     Recent Labs  12/15/15 1558  NA 141  K 3.4*  CL 106  CO2 28  GLUCOSE 149*  BUN 21*  CREATININE 1.55*  CALCIUM 9.0   No results for input(s): LABPT, INR in the last 72 hours. No results for input(s): LABURIN in the last 72 hours. Results for orders placed or performed during the hospital encounter of 12/12/15  Urine culture     Status: Abnormal   Collection Time: 12/12/15  9:35 AM  Result Value Ref Range Status   Specimen Description URINE, CLEAN CATCH  Final   Special Requests NONE  Final   Culture >=100,000 COLONIES/mL KLEBSIELLA PNEUMONIAE (A)  Final   Report Status 12/14/2015 FINAL  Final   Organism ID, Bacteria KLEBSIELLA PNEUMONIAE (A)  Final      Susceptibility   Klebsiella pneumoniae - MIC*    AMPICILLIN >=32 RESISTANT Resistant     CEFAZOLIN <=4 SENSITIVE Sensitive     CEFTRIAXONE <=1 SENSITIVE Sensitive     CIPROFLOXACIN <=0.25 SENSITIVE Sensitive     GENTAMICIN <=1 SENSITIVE Sensitive     IMIPENEM <=0.25 SENSITIVE Sensitive     NITROFURANTOIN 64 INTERMEDIATE Intermediate     TRIMETH/SULFA <=20 SENSITIVE Sensitive     AMPICILLIN/SULBACTAM 4 SENSITIVE Sensitive     PIP/TAZO <=4 SENSITIVE Sensitive     * >=100,000 COLONIES/mL KLEBSIELLA PNEUMONIAE    Disposition: Home  Discharge instruction: The patient was instructed to be ambulatory but told to refrain from heavy lifting, strenuous activity, or driving.   Discharge medications:   Medication List  STOP taking these medications        alfuzosin 10 MG 24 hr tablet  Commonly known as:  UROXATRAL     traMADol 50 MG tablet  Commonly known as:  ULTRAM      TAKE these medications        acetaminophen 500 MG tablet  Commonly known as:  TYLENOL  Take 500 mg by mouth every 6 (six) hours as needed for fever.     amLODipine 5 MG tablet  Commonly known as:  NORVASC  Take 5 mg by mouth daily.     aspirin EC 81 MG tablet  Take 81 mg by mouth daily.     ciprofloxacin 500 MG tablet  Commonly known as:   CIPRO  Take 500 mg by mouth 2 (two) times daily.     docusate sodium 100 MG capsule  Commonly known as:  COLACE  Take 1 capsule (100 mg total) by mouth 2 (two) times daily as needed for mild constipation.     folic acid 871 MCG tablet  Commonly known as:  FOLVITE  Take 400 mcg by mouth daily.     levothyroxine 75 MCG tablet  Commonly known as:  SYNTHROID, LEVOTHROID  Take 75 mcg by mouth daily.     Magnesium 250 MG Tabs  Take 250 mg by mouth daily.     oxyCODONE-acetaminophen 5-325 MG tablet  Commonly known as:  ROXICET  Take 1-2 tablets by mouth every 4 (four) hours as needed for moderate pain or severe pain.     simvastatin 20 MG tablet  Commonly known as:  ZOCOR  Take 20 mg by mouth daily.     VITAMIN B12 PO  Take 1 tablet by mouth daily.     Vitamin D (Cholecalciferol) 1000 units Tabs  Take 1 tablet by mouth daily.        Followup:      Follow-up Information    Follow up with Ardis Hughs, MD On 12/22/2015.   Specialty:  Urology   Why:  12pm   Contact information:   [address] [place] [address] [phone]

## 2015-12-17 NOTE — Discharge Instructions (Signed)
Activity:  You are encouraged to ambulate frequently (about every hour during waking hours) to help prevent blood clots from forming in your legs or lungs.  However, you should not engage in any heavy lifting (> 10-15 lbs), strenuous activity, or straining.  Diet: You should advance your diet as instructed by your physician.  It will be normal to have some bloating, nausea, and abdominal discomfort intermittently.  Prescriptions:  You will be provided a prescription for pain medication to take as needed.  If your pain is not severe enough to require the prescription pain medication, you may take extra strength Tylenol instead which will have less side effects.  You should also take a prescribed stool softener to avoid straining with bowel movements as the prescription pain medication may constipate you.  Incisions: You may remove your dressing bandages 48 hours after surgery if not removed in the hospital.  You will either have some small staples or special tissue glue at each of the incision sites. Once the bandages are removed (if present), the incisions may stay open to air.  You may start showering (but not soaking or bathing in water) the 2nd day after surgery and the incisions simply need to be patted dry after the shower.  No additional care is needed.  What to call us about: You should call the office ([phone]) if you develop fever > 101 or develop persistent vomiting. Activity:  You are encouraged to ambulate frequently (about every hour during waking hours) to help prevent blood clots from forming in your legs or lungs.  However, you should not engage in any heavy lifting (> 10-15 lbs), strenuous activity, or straining.    Foley Catheter Care A soft, flexible tube (Foley catheter) may have been placed in your bladder to drain urine and fluid. Follow these instructions: Taking Care of the Catheter Keep the area where the catheter leaves your body clean.  Attach the catheter to the leg so  there is no tension on the catheter.  Keep the drainage bag below the level of the bladder, but keep it OFF the floor.  Do not take long soaking baths. Your caregiver will give instructions about showering.  Wash your hands before touching ANYTHING related to the catheter or bag.  Using mild soap and warm water on a washcloth:  Clean the area closest to the catheter insertion site using a circular motion around the catheter.  Clean the catheter itself by wiping AWAY from the insertion site for several inches down the tube.  NEVER wipe upward as this could sweep bacteria up into the urethra (tube in your body that normally drains the bladder) and cause infection.  Place a small amount of sterile lubricant at the tip of the penis where the catheter is entering.  Taking Care of the Drainage Bags Two drainage bags may be taken home: a large overnight drainage bag, and a smaller leg bag which fits underneath clothing.  It is okay to wear the overnight bag at any time, but NEVER wear the smaller leg bag at night.  Keep the drainage bag well below the level of your bladder. This prevents backflow of urine into the bladder and allows the urine to drain freely.  Anchor the tubing to your leg to prevent pulling or tension on the catheter. Use tape or a leg strap provided by the hospital.  Empty the drainage bag when it is 1/2 to 3/4 full. Wash your hands before and after touching the bag.  Periodically   check the tubing for kinks to make sure there is no pressure on the tubing which could restrict the flow of urine.  Changing the Drainage Bags Cleanse both ends of the clean bag with alcohol before changing.  Pinch off the rubber catheter to avoid urine spillage during the disconnection.  Disconnect the dirty bag and connect the clean one.  Empty the dirty bag carefully to avoid a urine spill.  Attach the new bag to the leg with tape or a leg strap.  Cleaning the Drainage Bags Whenever a drainage bag is  disconnected, it must be cleaned quickly so it is ready for the next use.  Wash the bag in warm, soapy water.  Rinse the bag thoroughly with warm water.  Soak the bag for 30 minutes in a solution of white vinegar and water (1 cup vinegar to 1 quart warm water).  Rinse with warm water.  SEEK MEDICAL CARE IF:  You have chills or night sweats.  You are leaking around your catheter or have problems with your catheter. It is not uncommon to have sporadic leakage around your catheter as a result of bladder spasms. If the leakage stops, there is not much need for concern. If you are uncertain, call your caregiver.  You develop side effects that you think are coming from your medicines.  SEEK IMMEDIATE MEDICAL CARE IF:  You are suddenly unable to urinate. Check to see if there are any kinks in the drainage tubing that may cause this. If you cannot find any kinks, call your caregiver immediately. This is an emergency.  You develop shortness of breath or chest pains.  Bleeding persists or clots develop in your urine.  You have a fever.  You develop pain in your back or over your lower belly (abdomen).  You develop pain or swelling in your legs.  Any problems you are having get worse rather than better.  MAKE SURE YOU:  Understand these instructions.  Will watch your condition.  Will get help right away if you are not doing well or get worse.   

## 2016-05-08 ENCOUNTER — Other Ambulatory Visit: Payer: Self-pay | Admitting: Otolaryngology

## 2016-05-08 ENCOUNTER — Encounter (HOSPITAL_COMMUNITY): Payer: Self-pay

## 2016-05-08 NOTE — Pre-Procedure Instructions (Signed)
    Randy Ford  05/08/2016      [facility] - [place], [place] - [address] [place] [address] Phone: [phone] Fax: [phone]    Your procedure is scheduled on Wednesday, December 13th, 2017.  Report to [facility] Admitting at 8:30 A.M.   Call this number if you have problems the morning of surgery:  [phone]   Remember:  Do not eat food or drink liquids after midnight.   Take these medicines the morning of surgery with A SIP OF WATER: Acetaminophen (Tylenol) if needed, Amlodipine (Norvasc), Levothyroxine (Synthroid).  Stop taking: Aspirin, NSAIDs, Aleve, Naproxen, Ibuprofen, Advil, Motrin, BC's, Goody's, Fish oil, all herbal medications, and all vitamins.   Do not wear jewelry.  Do not wear lotions, powders, or colognes, or deoderant.  Men may shave face and neck.  Do not bring valuables to the hospital.  [facility] is not responsible for any belongings or valuables.  Contacts, dentures or bridgework may not be worn into surgery.  Leave your suitcase in the car.  After surgery it may be brought to your room.  For patients admitted to the hospital, discharge time will be determined by your treatment team.  Patients discharged the day of surgery will not be allowed to drive home.   Special instructions:  Preparing for Surgery.   [facility]- Preparing For Surgery  Before surgery, you can play an important role. Because skin is not sterile, your skin needs to be as free of germs as possible. You can reduce the number of germs on your skin by washing with CHG (chlorahexidine gluconate) Soap before surgery.  CHG is an antiseptic cleaner which kills germs and bonds with the skin to continue killing germs even after washing.  Please do not use if you have an allergy to CHG or antibacterial soaps. If your skin becomes reddened/irritated stop using the CHG.  Do not shave (including legs and underarms) for at least 48 hours prior to  first CHG shower. It is OK to shave your face.  Please follow these instructions carefully.   1. Shower the NIGHT BEFORE SURGERY and the MORNING OF SURGERY with CHG.   2. If you chose to wash your hair, wash your hair first as usual with your normal shampoo.  3. After you shampoo, rinse your hair and body thoroughly to remove the shampoo.  4. Use CHG as you would any other liquid soap. You can apply CHG directly to the skin and wash gently with a scrungie or a clean washcloth.   5. Apply the CHG Soap to your body ONLY FROM THE NECK DOWN.  Do not use on open wounds or open sores. Avoid contact with your eyes, ears, mouth and genitals (private parts). Wash genitals (private parts) with your normal soap.  6. Wash thoroughly, paying special attention to the area where your surgery will be performed.  7. Thoroughly rinse your body with warm water from the neck down.  8. DO NOT shower/wash with your normal soap after using and rinsing off the CHG Soap.  9. Pat yourself dry with a CLEAN TOWEL.   10. Wear CLEAN PAJAMAS   11. Place CLEAN SHEETS on your bed the night of your first shower and DO NOT SLEEP WITH PETS.  Day of Surgery: Do not apply any deodorants/lotions. Please wear clean clothes to the hospital/surgery center.     Please read over the following fact sheets that you were given.

## 2016-05-09 ENCOUNTER — Encounter (HOSPITAL_COMMUNITY): Payer: Self-pay

## 2016-05-09 ENCOUNTER — Encounter (HOSPITAL_COMMUNITY)
Admission: RE | Admit: 2016-05-09 | Discharge: 2016-05-09 | Disposition: A | Discharge status: Home / Self Care | Payer: Medicare Other | Source: Ambulatory Visit | Attending: Otolaryngology | Admitting: Otolaryngology

## 2016-05-09 DIAGNOSIS — Z01818 Encounter for other preprocedural examination: Secondary | ICD-10-CM | POA: Diagnosis not present

## 2016-05-09 LAB — BASIC METABOLIC PANEL
ANION GAP: 10 (ref 5–15)
BUN: 17 mg/dL (ref 6–20)
CO2: 27 mmol/L (ref 22–32)
Calcium: 9.6 mg/dL (ref 8.9–10.3)
Chloride: 101 mmol/L (ref 101–111)
Creatinine, Ser: 1.26 mg/dL — ABNORMAL HIGH (ref 0.61–1.24)
GFR calc Af Amer: >60 mL/min (ref >60)
GFR, EST NON AFRICAN AMERICAN: 55 mL/min — AB (ref >60)
Glucose, Bld: 93 mg/dL (ref 65–99)
POTASSIUM: 4 mmol/L (ref 3.5–5.1)
SODIUM: 138 mmol/L (ref 135–145)

## 2016-05-09 LAB — CBC
HCT: 35.8 % — ABNORMAL LOW (ref 39.0–52.0)
Hemoglobin: 12.3 g/dL — ABNORMAL LOW (ref 13.0–17.0)
MCH: 29.1 pg (ref 26.0–34.0)
MCHC: 34.4 g/dL (ref 30.0–36.0)
MCV: 84.8 fL (ref 78.0–100.0)
PLATELETS: 279 10*3/uL (ref 150–400)
RBC: 4.22 MIL/uL (ref 4.22–5.81)
RDW: 14.6 % (ref 11.5–15.5)
WBC: 5.4 10*3/uL (ref 4.0–10.5)

## 2016-05-09 NOTE — Progress Notes (Signed)
PCP - Dr. Seward Carol Cardiologist - Dr. Gwenlyn Found  EKG - 08/23/15 CXR - denies Echo - denies Stress test- 08/2014 Cardiac Cath - denies  Patient denies chest pain and shortness of breath at PAT appointment.

## 2016-05-15 ENCOUNTER — Ambulatory Visit (HOSPITAL_COMMUNITY): Payer: Medicare Other | Admitting: Certified Registered Nurse Anesthetist

## 2016-05-15 ENCOUNTER — Encounter (HOSPITAL_COMMUNITY)
Admission: RE | Disposition: A | Discharge status: Home / Self Care | Payer: Self-pay | Source: Ambulatory Visit | Attending: Otolaryngology

## 2016-05-15 ENCOUNTER — Ambulatory Visit (HOSPITAL_COMMUNITY)
Admission: RE | Admit: 2016-05-15 | Discharge: 2016-05-15 | Disposition: A | Discharge status: Home / Self Care | Payer: Medicare Other | Source: Ambulatory Visit | Attending: Otolaryngology | Admitting: Otolaryngology

## 2016-05-15 ENCOUNTER — Encounter (HOSPITAL_COMMUNITY): Payer: Self-pay | Admitting: Certified Registered Nurse Anesthetist

## 2016-05-15 DIAGNOSIS — E039 Hypothyroidism, unspecified: Secondary | ICD-10-CM | POA: Diagnosis not present

## 2016-05-15 DIAGNOSIS — J342 Deviated nasal septum: Secondary | ICD-10-CM | POA: Insufficient documentation

## 2016-05-15 DIAGNOSIS — G4733 Obstructive sleep apnea (adult) (pediatric): Secondary | ICD-10-CM | POA: Insufficient documentation

## 2016-05-15 DIAGNOSIS — E7800 Pure hypercholesterolemia, unspecified: Secondary | ICD-10-CM | POA: Insufficient documentation

## 2016-05-15 DIAGNOSIS — Z7982 Long term (current) use of aspirin: Secondary | ICD-10-CM | POA: Diagnosis not present

## 2016-05-15 DIAGNOSIS — J343 Hypertrophy of nasal turbinates: Secondary | ICD-10-CM | POA: Insufficient documentation

## 2016-05-15 DIAGNOSIS — I10 Essential (primary) hypertension: Secondary | ICD-10-CM | POA: Diagnosis not present

## 2016-05-15 HISTORY — PX: NASAL SEPTOPLASTY W/ TURBINOPLASTY: SHX2070

## 2016-05-15 SURGERY — SEPTOPLASTY, NOSE, WITH NASAL TURBINATE REDUCTION
Anesthesia: General | Site: Nose | Laterality: Bilateral

## 2016-05-15 MED ORDER — FENTANYL CITRATE (PF) 100 MCG/2ML IJ SOLN
INTRAMUSCULAR | Status: AC
  Filled 2016-05-15: qty 4

## 2016-05-15 MED ORDER — MEPERIDINE HCL 25 MG/ML IJ SOLN: 6.2500 mg | INTRAMUSCULAR | Status: DC | PRN

## 2016-05-15 MED ORDER — OXYMETAZOLINE HCL 0.05 % NA SOLN
1.0000 | Freq: Two times a day (BID) | NASAL | Status: DC
  Administered 2016-05-15: 1 via NASAL
  Filled 2016-05-15: qty 15

## 2016-05-15 MED ORDER — MUPIROCIN CALCIUM 2 % EX CREA
TOPICAL_CREAM | CUTANEOUS | Status: AC
  Filled 2016-05-15: qty 15

## 2016-05-15 MED ORDER — LIDOCAINE 2% (20 MG/ML) 5 ML SYRINGE
INTRAMUSCULAR | Status: DC | PRN
  Administered 2016-05-15: 100 mg via INTRAVENOUS

## 2016-05-15 MED ORDER — OXYMETAZOLINE HCL 0.05 % NA SOLN
NASAL | Status: DC | PRN
  Administered 2016-05-15: 1 via TOPICAL

## 2016-05-15 MED ORDER — FENTANYL CITRATE (PF) 100 MCG/2ML IJ SOLN
INTRAMUSCULAR | Status: DC | PRN
  Administered 2016-05-15: 100 ug via INTRAVENOUS
  Administered 2016-05-15: 50 ug via INTRAVENOUS

## 2016-05-15 MED ORDER — SUGAMMADEX SODIUM 200 MG/2ML IV SOLN
INTRAVENOUS | Status: DC | PRN
  Administered 2016-05-15: 200 mg via INTRAVENOUS

## 2016-05-15 MED ORDER — ROCURONIUM BROMIDE 10 MG/ML (PF) SYRINGE
PREFILLED_SYRINGE | INTRAVENOUS | Status: DC | PRN
  Administered 2016-05-15: 50 mg via INTRAVENOUS

## 2016-05-15 MED ORDER — CHLORHEXIDINE GLUCONATE CLOTH 2 % EX PADS: 6.0000 | MEDICATED_PAD | Freq: Once | CUTANEOUS | Status: DC

## 2016-05-15 MED ORDER — MUPIROCIN CALCIUM 2 % EX CREA
TOPICAL_CREAM | CUTANEOUS | Status: DC | PRN
  Administered 2016-05-15: 1 via TOPICAL

## 2016-05-15 MED ORDER — PROPOFOL 10 MG/ML IV BOLUS
INTRAVENOUS | Status: DC | PRN
  Administered 2016-05-15: 200 mg via INTRAVENOUS

## 2016-05-15 MED ORDER — DEXAMETHASONE SODIUM PHOSPHATE 10 MG/ML IJ SOLN
10.0000 mg | Freq: Once | INTRAMUSCULAR | Status: AC
  Administered 2016-05-15: 10 mg via INTRAVENOUS
  Filled 2016-05-15: qty 1

## 2016-05-15 MED ORDER — OXYMETAZOLINE HCL 0.05 % NA SOLN
NASAL | Status: AC
  Filled 2016-05-15: qty 15

## 2016-05-15 MED ORDER — 0.9 % SODIUM CHLORIDE (POUR BTL) OPTIME
TOPICAL | Status: DC | PRN
  Administered 2016-05-15: 1000 mL

## 2016-05-15 MED ORDER — LIDOCAINE-EPINEPHRINE (PF) 1 %-1:200000 IJ SOLN
INTRAMUSCULAR | Status: AC
  Filled 2016-05-15: qty 30

## 2016-05-15 MED ORDER — PROPOFOL 10 MG/ML IV BOLUS
INTRAVENOUS | Status: AC
  Filled 2016-05-15: qty 20

## 2016-05-15 MED ORDER — ONDANSETRON HCL 4 MG/2ML IJ SOLN
INTRAMUSCULAR | Status: DC | PRN
  Administered 2016-05-15: 4 mg via INTRAVENOUS

## 2016-05-15 MED ORDER — LACTATED RINGERS IV SOLN
INTRAVENOUS | Status: DC
  Administered 2016-05-15 (×2): via INTRAVENOUS

## 2016-05-15 MED ORDER — AMOXICILLIN-POT CLAVULANATE 500-125 MG PO TABS: 1.0000 | ORAL_TABLET | Freq: Two times a day (BID) | ORAL | 0 refills | Status: DC

## 2016-05-15 MED ORDER — LIDOCAINE-EPINEPHRINE (PF) 1 %-1:200000 IJ SOLN
INTRAMUSCULAR | Status: DC | PRN
  Administered 2016-05-15: 8 mL

## 2016-05-15 MED ORDER — CEFAZOLIN SODIUM-DEXTROSE 2-4 GM/100ML-% IV SOLN
2.0000 g | INTRAVENOUS | Status: AC
  Administered 2016-05-15: 2 g via INTRAVENOUS
  Filled 2016-05-15: qty 100

## 2016-05-15 MED ORDER — HYDROCODONE-ACETAMINOPHEN 5-325 MG PO TABS: 1.0000 | ORAL_TABLET | Freq: Four times a day (QID) | ORAL | 0 refills | Status: DC | PRN

## 2016-05-15 MED ORDER — FENTANYL CITRATE (PF) 100 MCG/2ML IJ SOLN: 25.0000 ug | INTRAMUSCULAR | Status: DC | PRN

## 2016-05-15 MED ORDER — METOCLOPRAMIDE HCL 5 MG/ML IJ SOLN: 10.0000 mg | Freq: Once | INTRAMUSCULAR | Status: DC | PRN

## 2016-05-15 SURGICAL SUPPLY — 26 items
BLADE SURG 15 STRL LF DISP TIS (BLADE) ×1 IMPLANT
BLADE SURG 15 STRL SS (BLADE) ×3
CANISTER SUCTION 2500CC (MISCELLANEOUS) ×3 IMPLANT
COAGULATOR SUCT 8FR VV (MISCELLANEOUS) IMPLANT
DRAPE PROXIMA HALF (DRAPES) IMPLANT
ELECT REM PT RETURN 9FT ADLT (ELECTROSURGICAL) ×3
ELECTRODE REM PT RTRN 9FT ADLT (ELECTROSURGICAL) IMPLANT
GAUZE SPONGE 2X2 8PLY STRL LF (GAUZE/BANDAGES/DRESSINGS) ×1 IMPLANT
GLOVE BIOGEL M 7.0 STRL (GLOVE) ×6 IMPLANT
GOWN STRL REUS W/ TWL LRG LVL3 (GOWN DISPOSABLE) ×2 IMPLANT
GOWN STRL REUS W/TWL LRG LVL3 (GOWN DISPOSABLE) ×3
KIT BASIN OR (CUSTOM PROCEDURE TRAY) ×3 IMPLANT
KIT ROOM TURNOVER OR (KITS) ×3 IMPLANT
NDL HYPO 25GX1X1/2 BEV (NEEDLE) ×1 IMPLANT
NEEDLE HYPO 25GX1X1/2 BEV (NEEDLE) ×3 IMPLANT
NS IRRIG 1000ML POUR BTL (IV SOLUTION) ×3 IMPLANT
PAD ARMBOARD 7.5X6 YLW CONV (MISCELLANEOUS) ×3 IMPLANT
SPLINT NASAL DOYLE BI-VL (GAUZE/BANDAGES/DRESSINGS) ×3 IMPLANT
SPONGE GAUZE 2X2 STER 10/PKG (GAUZE/BANDAGES/DRESSINGS) ×2
SPONGE NEURO XRAY DETECT 1X3 (DISPOSABLE) ×3 IMPLANT
SUT ETHILON 3 0 PS 1 (SUTURE) ×3 IMPLANT
SUT PLAIN 4 0 ~~LOC~~ 1 (SUTURE) ×3 IMPLANT
TOWEL OR 17X24 6PK STRL BLUE (TOWEL DISPOSABLE) ×3 IMPLANT
TRAY ENT MC OR (CUSTOM PROCEDURE TRAY) ×3 IMPLANT
TUBE SALEM SUMP 16 FR W/ARV (TUBING) ×1 IMPLANT
TUBING EXTENTION W/L.L. (IV SETS) ×3 IMPLANT

## 2016-05-15 NOTE — Anesthesia Preprocedure Evaluation (Signed)
Anesthesia Evaluation  Patient identified by MRN, date of birth, ID band Patient awake    Airway Mallampati: II  TM Distance: >3 FB Neck ROM: Full    Dental  (+) Teeth Intact, Dental Advisory Given   Pulmonary sleep apnea and Continuous Positive Airway Pressure Ventilation ,    breath sounds clear to auscultation       Cardiovascular hypertension, Pt. on medications  Rhythm:Regular Rate:Normal     Neuro/Psych    GI/Hepatic   Endo/Other    Renal/GU Renal disease     Musculoskeletal  (+) Arthritis ,   Abdominal Normal abdominal exam  (+)   Peds  Hematology  (+) anemia ,   Anesthesia Other Findings   Reproductive/Obstetrics                             Anesthesia Physical Anesthesia Plan  ASA: III  Anesthesia Plan: General   Post-op Pain Management:    Induction: Intravenous  Airway Management Planned: Oral ETT  Additional Equipment:   Intra-op Plan:   Post-operative Plan: Extubation in OR  Informed Consent: I have reviewed the patients History and Physical, chart, labs and discussed the procedure including the risks, benefits and alternatives for the proposed anesthesia with the patient or authorized representative who has indicated his/her understanding and acceptance.   Dental advisory given  Plan Discussed with: Anesthesiologist and Surgeon  Anesthesia Plan Comments:         Anesthesia Quick Evaluation

## 2016-05-15 NOTE — H&P (Signed)
Randy Ford is an 75 y.o. male.   Chief Complaint: Nasal obstruction HPI: Nasal septal deviation with obstruction  Past Medical History:  Diagnosis Date  . Anemia   . Arthritis    osteoarthritis rt foot; gout  . Chest pain    denies presently; myoview,holter monitor wnl; Dr Gwenlyn Found cardiologist  . Dysuria-frequency syndrome   . Eczema   . Foley catheter in place   . High cholesterol   . History of urinary tract infection   . Hydronephrosis, bilateral   . Hyperlipidemia   . Hypertension   . Hypothyroidism   . IBS (irritable bowel syndrome)   . Nocturia   . OSA (obstructive sleep apnea) 08/08/2008   AHI 1.09/hr  . Tendonitis, Achilles, right   . Wears glasses     Past Surgical History:  Procedure Laterality Date  . benign lipomas on arms  Bilateral    removed 2016  . CATARACT EXTRACTION     x2  . CYSTOSCOPY W/ URETERAL STENT PLACEMENT Bilateral 10/06/2015   Procedure: CYSTOSCOPY WITH BILATERAL RETROGRADE PYELOGRAM BILATERAL URETERAL STENT PLACEMENT WITH LEFT URETEROSCOPY;  Surgeon: Ardis Hughs, MD;  Location: [facility];  Service: Urology;  Laterality: Bilateral;  . EYE SURGERY Bilateral    detached retina; 3 on left eye; once on right  . MASS EXCISION Bilateral    fatty tumors rmoved from arms  . PROSTATECTOMY N/A 12/15/2015   Procedure: OPEN SIMPLE PROSTATECTOMY;  Surgeon: Ardis Hughs, MD;  Location: [facility];  Service: Urology;  Laterality: N/A;  . TONSILLECTOMY  1948  . TONSILLECTOMY      Family History  Problem Relation Age of Onset  . Cancer Mother     Bladder  . Dementia Mother   . Cancer Father     spinal   Social History:  reports that he has never smoked. He has never used smokeless tobacco. He reports that he drinks alcohol. He reports that he does not use drugs.  Allergies:  Allergies  Allergen Reactions  . No Known Allergies     Medications Prior to Admission  Medication Sig Dispense Refill  . amLODipine (NORVASC) 5 MG  tablet Take 5 mg by mouth daily.    Marland Kitchen aspirin EC 81 MG tablet Take 81 mg by mouth daily.    . Cyanocobalamin (VITAMIN B12 PO) Take 1 tablet by mouth 2 (two) times a week.     . folic acid (FOLVITE) A999333 MCG tablet Take 400 mcg by mouth daily.    Marland Kitchen levothyroxine (SYNTHROID, LEVOTHROID) 75 MCG tablet Take 75 mcg by mouth daily.      . Magnesium 250 MG TABS Take 250 mg by mouth daily.    . Potassium 99 MG TABS Take 1 tablet by mouth daily.    . simvastatin (ZOCOR) 20 MG tablet Take 20 mg by mouth daily.     . Vitamin D, Cholecalciferol, 1000 UNITS TABS Take 1 tablet by mouth daily.    Marland Kitchen acetaminophen (TYLENOL) 500 MG tablet Take 500 mg by mouth every 6 (six) hours as needed for mild pain or fever.     . docusate sodium (COLACE) 100 MG capsule Take 1 capsule (100 mg total) by mouth 2 (two) times daily as needed for mild constipation. (Patient not taking: Reported on 05/06/2016) 60 capsule 0  . oxyCODONE-acetaminophen (ROXICET) 5-325 MG tablet Take 1-2 tablets by mouth every 4 (four) hours as needed for moderate pain or severe pain. (Patient not taking: Reported on 05/06/2016) 30  tablet 0    No results found for this or any previous visit (from the past 48 hour(s)). No results found.  Review of Systems  Constitutional: Negative.   HENT: Positive for congestion.   Respiratory: Negative.   Cardiovascular: Negative.     There were no vitals taken for this visit. Physical Exam  Constitutional: He appears well-developed.  HENT:  Nasal septal deviation with obstruction  Neck: Normal range of motion.  Cardiovascular: Normal rate.   Respiratory: Effort normal.  GI: Soft.     Assessment/Plan Adm for septoplasty and IT reduction  Dian Minahan, MD 05/15/2016, 8:35 AM

## 2016-05-15 NOTE — Brief Op Note (Signed)
05/15/2016  11:38 AM  PATIENT:  Randy Ford  75 y.o. male  PRE-OPERATIVE DIAGNOSIS:  deviated septum  POST-OPERATIVE DIAGNOSIS:  deviated septum  PROCEDURE:  Procedure(s): NASAL SEPTOPLASTY WITH BILATERAL INFERIOR TURBINATE REDUCTION (Bilateral)  SURGEON:  Surgeon(s) and Role:    * Jerrell Belfast, MD - Primary  PHYSICIAN ASSISTANT:   ASSISTANTS: none   ANESTHESIA:   general  EBL:  No intake/output data recorded.  BLOOD ADMINISTERED:none  DRAINS: none   LOCAL MEDICATIONS USED:  LIDOCAINE  and Amount: 8 ml  SPECIMEN:  No Specimen  DISPOSITION OF SPECIMEN:  N/A  COUNTS:  YES  TOURNIQUET:  * No tourniquets in log *  DICTATION: .Other Dictation: Dictation Number [id number]  PLAN OF CARE: Discharge to home after PACU  PATIENT DISPOSITION:  PACU - hemodynamically stable.   Delay start of Pharmacological VTE agent (>24hrs) due to surgical blood loss or risk of bleeding: not applicable

## 2016-05-15 NOTE — Anesthesia Preprocedure Evaluation (Signed)
Anesthesia Evaluation  Patient identified by MRN, date of birth, ID band Patient awake    Reviewed: Allergy & Precautions, NPO status , Patient's Chart, lab work & pertinent test results  Airway Mallampati: II  TM Distance: >3 FB Neck ROM: Full    Dental no notable dental hx.    Pulmonary sleep apnea and Continuous Positive Airway Pressure Ventilation ,    Pulmonary exam normal breath sounds clear to auscultation       Cardiovascular hypertension, Pt. on medications Normal cardiovascular exam Rhythm:Regular Rate:Normal     Neuro/Psych negative neurological ROS  negative psych ROS   GI/Hepatic negative GI ROS, Neg liver ROS,   Endo/Other  Hypothyroidism   Renal/GU negative Renal ROS  negative genitourinary   Musculoskeletal negative musculoskeletal ROS (+)   Abdominal   Peds negative pediatric ROS (+)  Hematology negative hematology ROS (+)   Anesthesia Other Findings   Reproductive/Obstetrics negative OB ROS                             Anesthesia Physical Anesthesia Plan  ASA: II  Anesthesia Plan: General   Post-op Pain Management:    Induction: Intravenous  Airway Management Planned: Oral ETT  Additional Equipment:   Intra-op Plan:   Post-operative Plan: Extubation in OR  Informed Consent: I have reviewed the patients History and Physical, chart, labs and discussed the procedure including the risks, benefits and alternatives for the proposed anesthesia with the patient or authorized representative who has indicated his/her understanding and acceptance.   Dental advisory given  Plan Discussed with: CRNA  Anesthesia Plan Comments:         Anesthesia Quick Evaluation

## 2016-05-15 NOTE — Transfer of Care (Signed)
Immediate Anesthesia Transfer of Care Note  Patient: Randy Ford  Procedure(s) Performed: Procedure(s): NASAL SEPTOPLASTY WITH BILATERAL INFERIOR TURBINATE REDUCTION (Bilateral)  Patient Location: PACU  Anesthesia Type:General  Level of Consciousness: awake, alert  and oriented  Airway & Oxygen Therapy: Patient Spontanous Breathing and Patient connected to face mask oxygen  Post-op Assessment: Report given to RN, Post -op Vital signs reviewed and stable and Patient moving all extremities X 4  Post vital signs: Reviewed and stable  Last Vitals:  Vitals:   05/15/16 0848 05/15/16 1146  BP: (!) 169/68 (!) (P) 161/69  Pulse: 66 (P) 74  Resp: 20 (P) 19  Temp: 36.7 C (P) 36.2 C    Last Pain:  Vitals:   05/15/16 0848  TempSrc: Oral      Patients Stated Pain Goal: 2 ([id number])  Complications: No apparent anesthesia complications

## 2016-05-15 NOTE — Anesthesia Postprocedure Evaluation (Signed)
Anesthesia Post Note  Patient: Randy Ford  Procedure(s) Performed: Procedure(s) (LRB): NASAL SEPTOPLASTY WITH BILATERAL INFERIOR TURBINATE REDUCTION (Bilateral)  Patient location during evaluation: PACU Anesthesia Type: General Level of consciousness: awake and alert Pain management: pain level controlled Vital Signs Assessment: post-procedure vital signs reviewed and stable Respiratory status: spontaneous breathing, nonlabored ventilation, respiratory function stable and patient connected to nasal cannula oxygen Cardiovascular status: blood pressure returned to baseline and stable Postop Assessment: no signs of nausea or vomiting Anesthetic complications: no    Last Vitals:  Vitals:   05/15/16 1300 05/15/16 1307  BP:  (!) 159/66  Pulse: 64 65  Resp: 18 18  Temp:      Last Pain:  Vitals:   05/15/16 1307  TempSrc:   PainSc: 0-No pain                 Montez Hageman

## 2016-05-16 ENCOUNTER — Encounter (HOSPITAL_COMMUNITY): Payer: Self-pay | Admitting: Otolaryngology

## 2016-05-16 NOTE — Op Note (Signed)
NAME:  Randy Ford, Randy Ford                     ACCOUNT NO.:  MEDICAL RECORD NO.:  [id number]  LOCATION:                                 FACILITY:  PHYSICIAN:  Verlaine Embry L. Wilburn Cornelia, M.D.DATE OF BIRTH:  28-Apr-1941  DATE OF PROCEDURE:  05/15/2016 DATE OF DISCHARGE:                              OPERATIVE REPORT   LOCATION:  [facility] Main OR.  PREOPERATIVE DIAGNOSES: 1. Deviated nasal septum with airway obstruction. 2. Bilateral inferior turbinate hypertrophy. 3. History of obstructive sleep apnea.  POSTOPERATIVE DIAGNOSIS: 1. Deviated nasal septum with airway obstruction. 2. Bilateral inferior turbinate hypertrophy. 3. History of obstructive sleep apnea.  INDICATIONS FOR SURGERY: 1. Deviated nasal septum with airway obstruction. 2. Bilateral inferior turbinate hypertrophy. 3. History of obstructive sleep apnea.  SURGICAL PROCEDURE: 1. Nasal septoplasty. 2. Bilateral inferior turbinate reduction.  ANESTHESIA:  General endotracheal.  COMPLICATIONS:  No complications.  BLOOD LOSS:  Less than 50 mL.  SURGEON:  Early Chars. Wilburn Cornelia, M.D.  The patient transferred from the operating room to the recovery room in stable condition.  BRIEF HISTORY:  The patient is a 75 year old white male who was referred for evaluation of chronic progressive nasal airway obstruction.  The patient has a history of obstructive sleep apnea, wears nightly CPAP with increasing difficulty because of nasal blockage.  Examination in the office showed a severely deviated nasal septum with right septal spurring and bilateral inferior turbinate hypertrophy.  The patient has been treated with appropriate medical therapy and despite treatment, continued to have worsening symptoms of nasal obstruction.  Given his history and findings, I recommended septoplasty and turbinate reduction. The risks and benefits of the procedure were discussed in detail with the patient and his wife and they understood and  agreed with our plan for surgery which is scheduled on elective basis at [place].  DESCRIPTION OF PROCEDURE:  The patient was brought to the operating room on May 15, 2016 and placed in supine position on the operating table.  General endotracheal anesthesia was established without difficulty.  The patient was positioned and prepped and draped.  A surgical time-out was then performed with correct identification of the patient and the surgical procedure.  His nose was then injected with 8 mL of 1% lidocaine, 1:200,000 dilution of epinephrine, which injected in a submucosal fashion along the nasal septum and inferior turbinates bilaterally.  The patient's nose then packed with Afrin-soaked cottonoid pledgets, were left in place for approximately 10 minutes to allow for vasoconstriction hemostasis.  With the patient prepped, draped, and prepared for surgery, a left anterior hemitransfixion incision was created and mucoperichondrial flap was elevated along the left-hand side.  The anterior cartilaginous septum was crossed and a mucoperichondrial flap was elevated on the right.  The anterior and mid septal cartilage were then removed.  This was later morselized and returned to the mucoperichondrial pocket. Dissection was then carried from anterior to posterior removing deviated bone and cartilage along the nasal septum.  The patient had a large right inferior bony septal spur, which was mobilized with a 4-mm osteotome and resected preserving the overlying mucosa.  With the septum brought to  midline position, the resected cartilage was morselized and returned to the mucoperichondrial pocket.  The mucosal flaps were then reapproximated with a 4-0 gut suture on a Keith needle in a horizontal mattress fashion.  At the conclusion of the procedure, bilateral Doyle nasal septal splints were placed after the application of Bactroban ointment and sutured in position with a  3-0 Ethilon suture.  Inferior turbinate reduction was then performed with cautery set at 12 watts.  Two submucosal passes were made in each inferior turbinate. When the turbinates had been adequately cauterized, anterior incisions were created, overlying soft tissue was elevated and a small amount of turbinate bone was resected.  The turbinates were then outfractured creating more patent nasal cavity.  The patient's nasal cavity was irrigated and suctioned.  Orogastric tube was passed.  Stomach contents aspirated.  The patient was awakened from his anesthetic.  He was extubated and then transferred from the operating room to the recovery room in stable condition.  There were no complications and blood loss was approximately 50 mL.          ______________________________ Early Chars. Wilburn Cornelia, M.D.     DLS/MEDQ  D:  [id number]  T:  05/16/2016  Job:  [id number]

## 2016-08-23 ENCOUNTER — Encounter: Payer: Self-pay | Admitting: Cardiovascular Disease

## 2016-08-23 ENCOUNTER — Ambulatory Visit (INDEPENDENT_AMBULATORY_CARE_PROVIDER_SITE_OTHER): Payer: Medicare Other | Admitting: Cardiovascular Disease

## 2016-08-23 VITALS — BP 156/80 | HR 68 | Ht 71.0 in | Wt 210.0 lb

## 2016-08-23 DIAGNOSIS — E7800 Pure hypercholesterolemia, unspecified: Secondary | ICD-10-CM

## 2016-08-23 DIAGNOSIS — I10 Essential (primary) hypertension: Secondary | ICD-10-CM | POA: Diagnosis not present

## 2016-08-23 DIAGNOSIS — G4733 Obstructive sleep apnea (adult) (pediatric): Secondary | ICD-10-CM

## 2016-08-23 NOTE — Assessment & Plan Note (Signed)
History of essential hypertension blood pressure measured at 156/80. He is on amlodipine. He is aware that he is overweight and is adjusting his calorie intake. He does exercise. Continue current meds at current dosing

## 2016-08-23 NOTE — Patient Instructions (Signed)

## 2016-08-23 NOTE — Assessment & Plan Note (Signed)
History of obstructive sleep apnea on CPAP which he benefits from 

## 2016-08-23 NOTE — Assessment & Plan Note (Signed)
History of hyperlipidemia on statin therapy followed by his PCP 

## 2016-08-23 NOTE — Progress Notes (Signed)
08/23/2016 Randy Ford   12-01-1940  [id number]  Primary Physician Kandice Hams, MD Primary Cardiologist: Lorretta Harp MD Renae Gloss  HPI:  Mr. Randy Ford is a 76 year old mildly overweight married Caucasian male with no children. I last saw him in the office 08/23/15. He has a history of hypertension, hyperlipidemia and obstructive sleep apnea on C Pap. He had a Myoview stress test performed on 06/24/2008 which was negative. His lipid profile followed by his primary care physician Dr. Delfina Redwood during he took an excessive amount of oral iron pills one month ago in attempt to increase his hemoglobin prior to voluntary blood donation after which he developed substernal chest pain which has been constant. His primary care physician thought this could either be "ischemia, reflux, or musculoskeletal. I performed a Myoview stress test on him 08/16/14 which was entirely remote normal as was a 2 week event monitor performed because of palpitations. Since I saw him a year ago he's remained asymptomatic.   Current Outpatient Prescriptions  Medication Sig Dispense Refill  . amLODipine (NORVASC) 5 MG tablet Take 5 mg by mouth daily.    . cyanocobalamin ([employer] VITAMIN B-12) 500 MCG tablet Take 500 mcg by mouth daily.    . folic acid (FOLVITE) 782 MCG tablet Take 400 mcg by mouth daily.    Marland Kitchen levothyroxine (SYNTHROID, LEVOTHROID) 75 MCG tablet Take 75 mcg by mouth daily.      . Magnesium 250 MG TABS Take 250 mg by mouth daily.    . Potassium 99 MG TABS Take 1 tablet by mouth daily.    . simvastatin (ZOCOR) 20 MG tablet Take 20 mg by mouth daily.     . Vitamin D, Cholecalciferol, 1000 UNITS TABS Take 1 tablet by mouth daily.     No current facility-administered medications for this visit.     Allergies  Allergen Reactions  . No Known Allergies     Social History   Social History  . Marital status: Married    Spouse name: N/A  . Number of children: N/A  . Years of education: N/A    Occupational History  . Retired     Pharmacist, hospital   Social History Main Topics  . Smoking status: Never Smoker  . Smokeless tobacco: Never Used  . Alcohol use Yes     Comment: rarely  . Drug use: No  . Sexual activity: Not on file   Other Topics Concern  . Not on file   Social History Narrative  . No narrative on file     Review of Systems: General: negative for chills, fever, night sweats or weight changes.  Cardiovascular: negative for chest pain, dyspnea on exertion, edema, orthopnea, palpitations, paroxysmal nocturnal dyspnea or shortness of breath Dermatological: negative for rash Respiratory: negative for cough or wheezing Urologic: negative for hematuria Abdominal: negative for nausea, vomiting, diarrhea, bright red blood per rectum, melena, or hematemesis Neurologic: negative for visual changes, syncope, or dizziness All other systems reviewed and are otherwise negative except as noted above.    Blood pressure (!) 156/80, pulse 68, height 5\' 11"  (1.803 m), weight 210 lb (95.3 kg).  General appearance: alert and no distress Neck: no adenopathy, no carotid bruit, no JVD, supple, symmetrical, trachea midline and thyroid not enlarged, symmetric, no tenderness/mass/nodules Lungs: clear to auscultation bilaterally Heart: regular rate and rhythm, S1, S2 normal, no murmur, click, rub or gallop Extremities: extremities normal, atraumatic, no cyanosis or edema  EKG sinus rhythm at 68 without  ST or T-wave changes. I personally reviewed this EKG.  ASSESSMENT AND PLAN:   Essential hypertension History of essential hypertension blood pressure measured at 156/80. He is on amlodipine. He is aware that he is overweight and is adjusting his calorie intake. He does exercise. Continue current meds at current dosing  Hyperlipidemia History of hyperlipidemia on statin therapy followed by his PCP  Obstructive sleep apnea History of obstructive sleep apnea on CPAP which he benefits  from      Lorretta Harp MD [facility], [facility] 08/23/2016 3:35 PM

## 2017-06-22 IMAGING — CT CT ABD-PELV W/O CM
2 of 4 series · 13 of 36 positions shown, 18 images · non-contrast
Comparison: None.

CLINICAL DATA: Bilateral hydronephrosis seen on recent ultrasound.
Pelvic pain and hematuria. Renal insufficiency. Enlarged prostate

EXAM:
CT ABDOMEN AND PELVIS WITHOUT CONTRAST
TECHNIQUE: Multidetector CT imaging of the abdomen and pelvis was performed
following the standard protocol without IV contrast.

[Series 601: coronal body · coronal · 0.90mm/px · 1 of 133 slices shown, 2 images]
[im 45/133  soft-tissue]
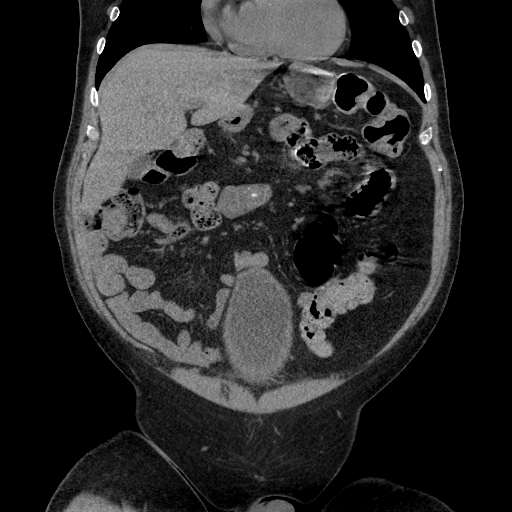
[im 45/133  bone]
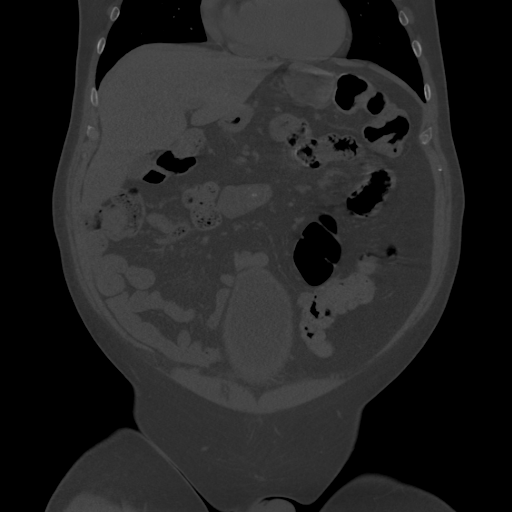

[Series 602: sagittal body · sagittal · 0.90mm/px · 12 of 162 slices shown, 16 images]
[im 9/162  soft-tissue]
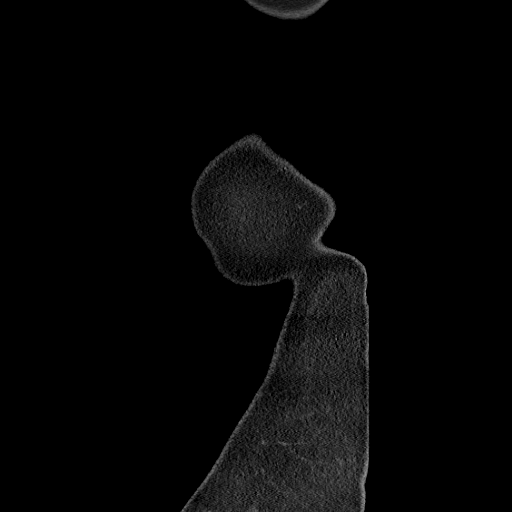
[im 9/162  lung]
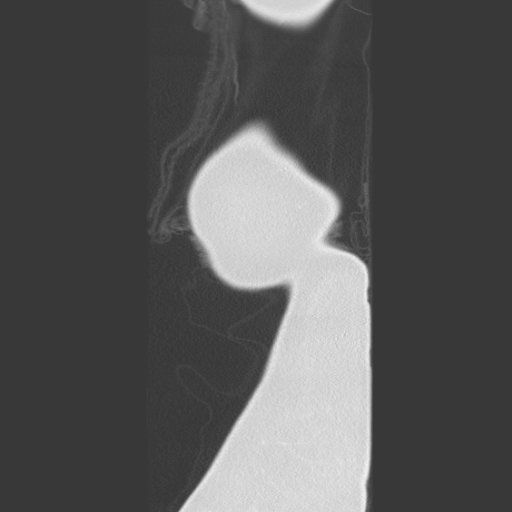
[im 9/162  bone]
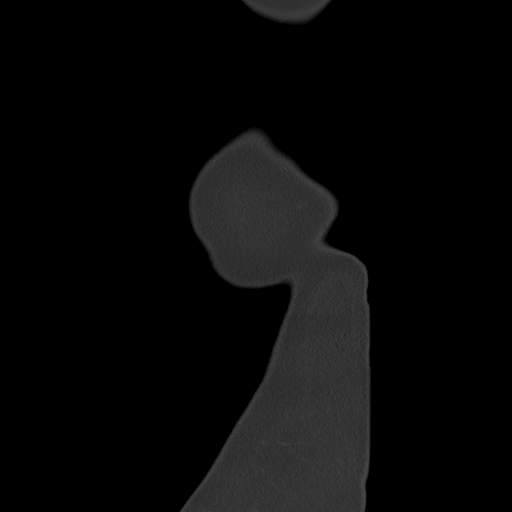
[im 18/162  lung]
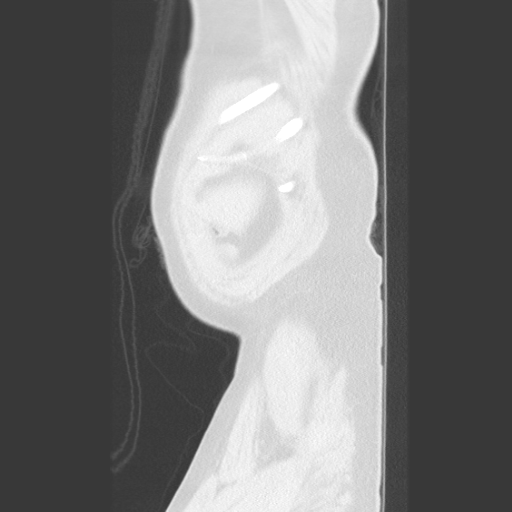
[im 27/162  soft-tissue]
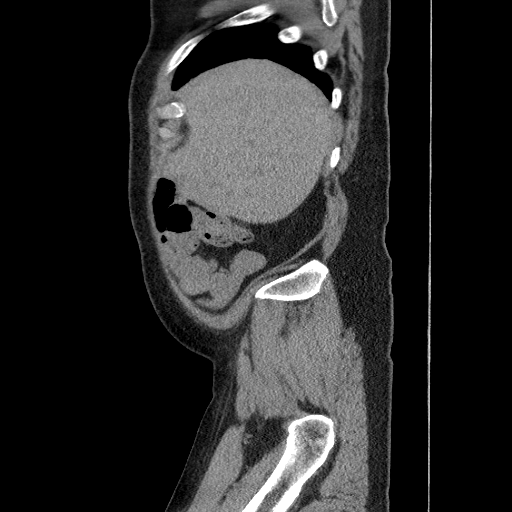
[im 27/162  lung]
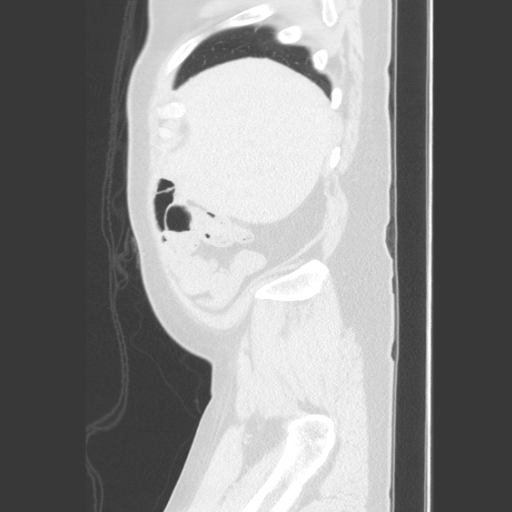
[im 36/162  lung]
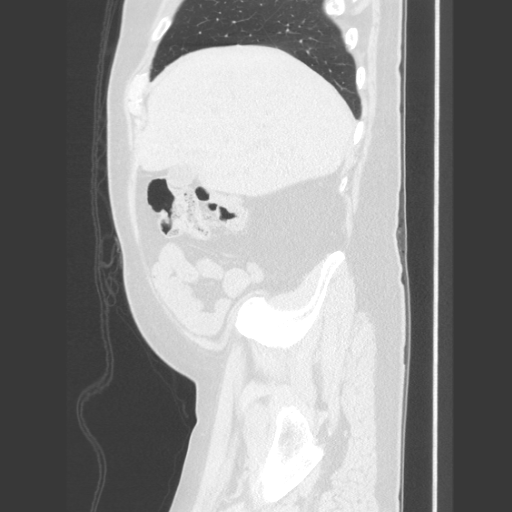
[im 45/162  soft-tissue]
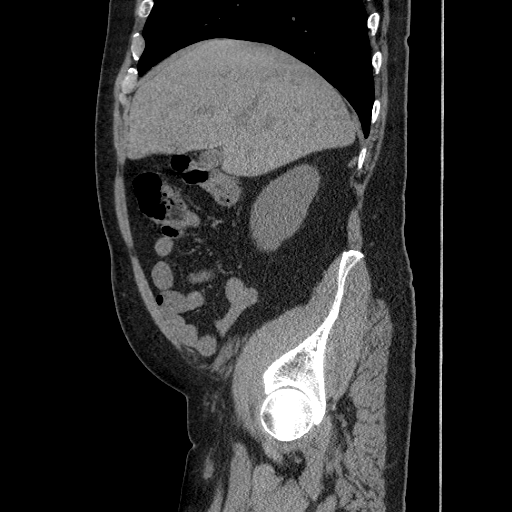
[im 54/162  soft-tissue]
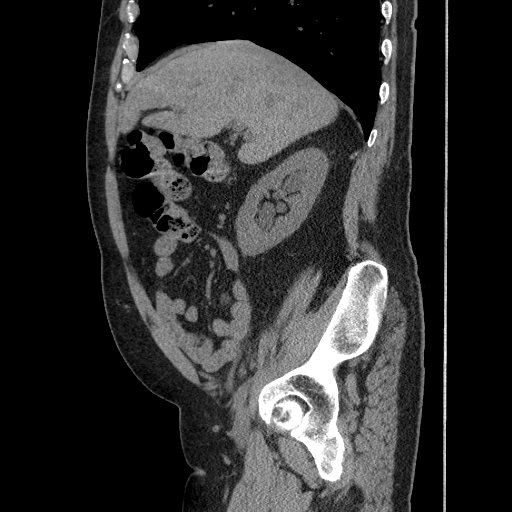
[im 72/162  soft-tissue]
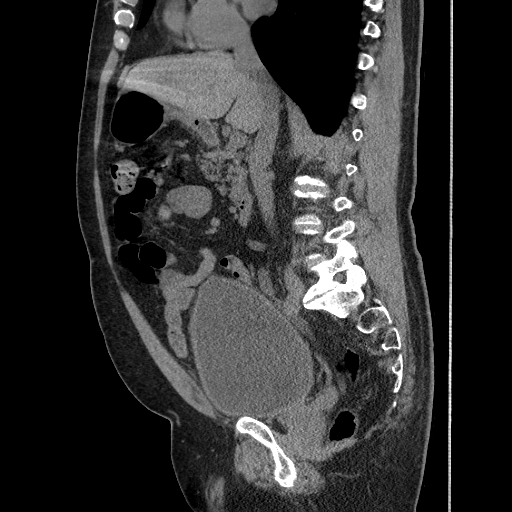
[im 90/162  soft-tissue]
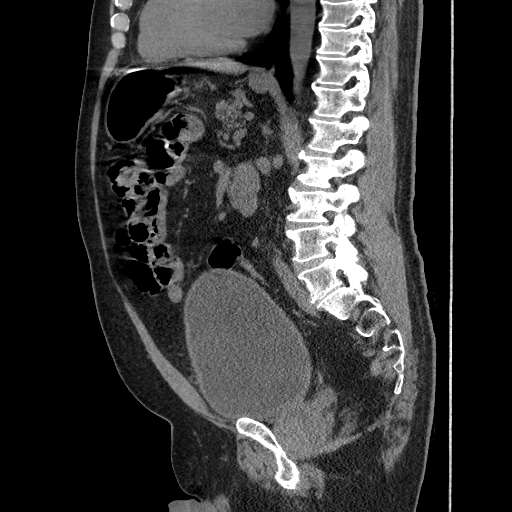
[im 108/162  soft-tissue]
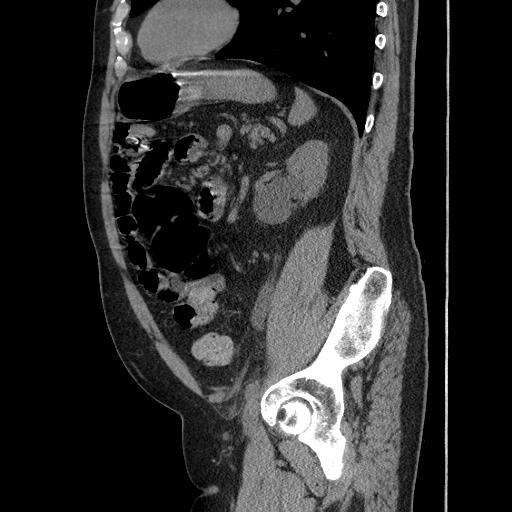
[im 117/162  soft-tissue]
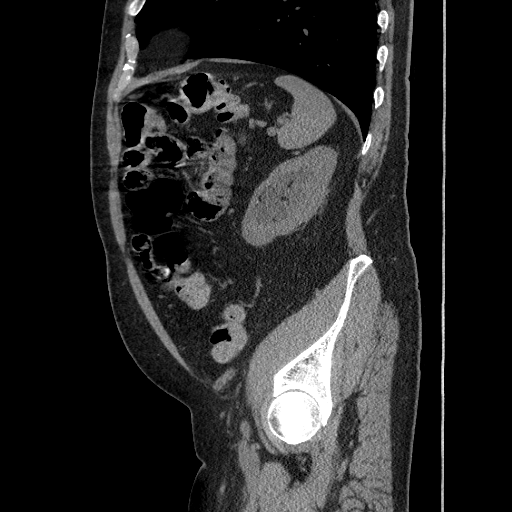
[im 135/162  soft-tissue]
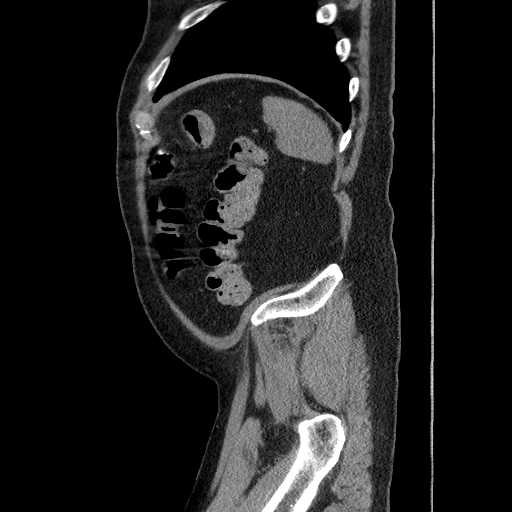
[im 135/162  bone]
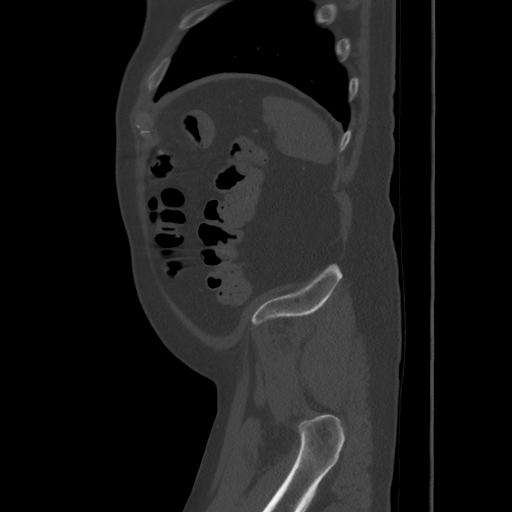
[im 153/162  soft-tissue]
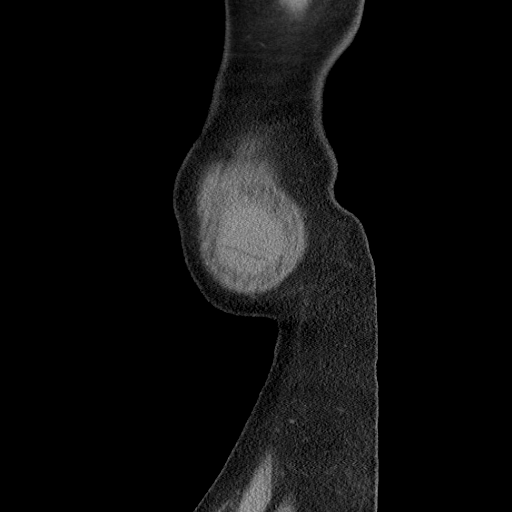

[13 of 36 positions shown; findings below may reference images not displayed]

FINDINGS: Lower chest:  No acute findings.

Hepatobiliary: No mass visualized on this un-enhanced exam.
Gallbladder is unremarkable.

Pancreas: No mass or inflammatory process identified on this
un-enhanced exam.

Spleen: Within normal limits in size.

Adrenals/Urinary Tract: 2.9 cm low-attenuation left adrenal mass is
seen consistent with benign adrenal adenoma. Right adrenal gland is
normal in appearance.

Moderate to severe bilateral hydronephrosis and ureterectasis is
seen to the level of the urinary bladder, left side slightly greater
than right. No renal calculi seen. No obstructing ureteral calculi
identified. A tiny approximately 1 mm calculus is seen along the
right posterior bladder wall.

The urinary bladder is mildly distended and shows mild diffuse wall
thickening. No focal bladder mass visualized on this unenhanced
exam.

Stomach/Bowel: No evidence of obstruction, inflammatory process, or
abnormal fluid collections.

Vascular/Lymphatic: No pathologically enlarged lymph nodes. No
evidence of abdominal aortic aneurysm.

Reproductive: Prostate gland is moderately enlarged with mass effect
on bladder base.

Other: None.

Musculoskeletal:  No suspicious bone lesions identified.
IMPRESSION: Moderate to severe bilateral hydronephrosis and ureterectasis to the
level of the urinary bladder. No ureteral calculi or other
obstructing etiology apparent on this unenhanced exam.

Moderately enlarged prostate. Distended urinary bladder and mild
diffuse bladder wall thickening. This suggests chronic bladder
outlet obstruction, and possible vesicoureteral reflux as etiology
for bilateral hydroureteronephrosis.

Tiny 1 mm calculus noted within urinary bladder.

Benign left adrenal adenoma incidentally noted.

## 2017-07-04 IMAGING — MR MR PROSTATE W/O CM
1 series · 19 of 30 positions shown · IV contrast (agent unspecified)
Comparison: Abdominopelvic CT of 09/21/2015.

CLINICAL DATA: Elevated PSA.  No known prior biopsy.

EXAM:
MR PROSTATE WITHOUT CONTRAST
TECHNIQUE: Multiplanar multisequence MRI images were obtained of the pelvis
centered about the prostate. Pre and post contrast images were
obtained.

[Series 7: T1 · axial · 3.0mm · 0.29mm/px · z∈[-53,+43]mm · 19 of 30 slices shown]
[im 1/30]
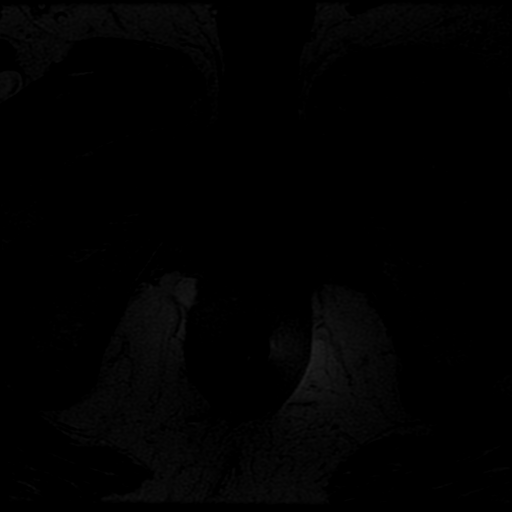
[im 2/30]
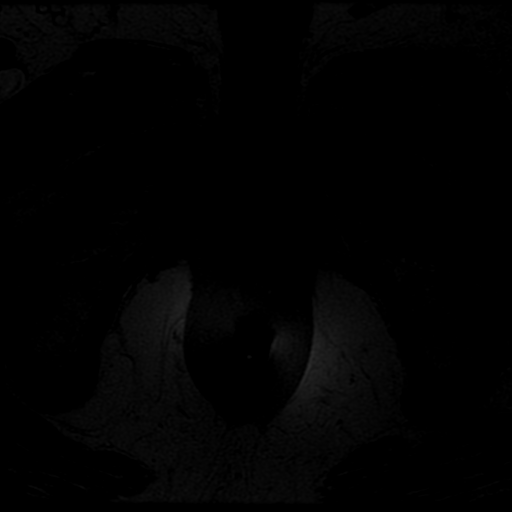
[im 3/30]
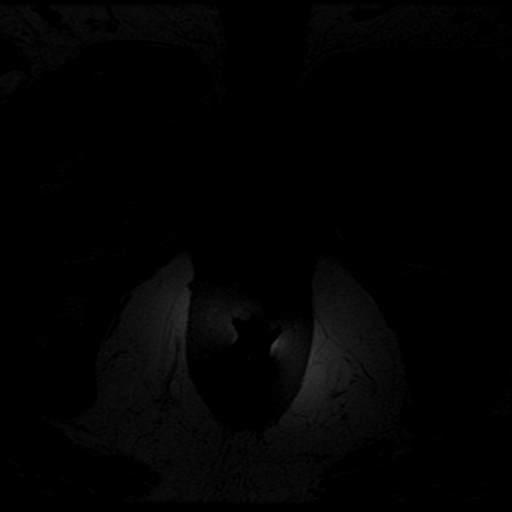
[im 4/30]
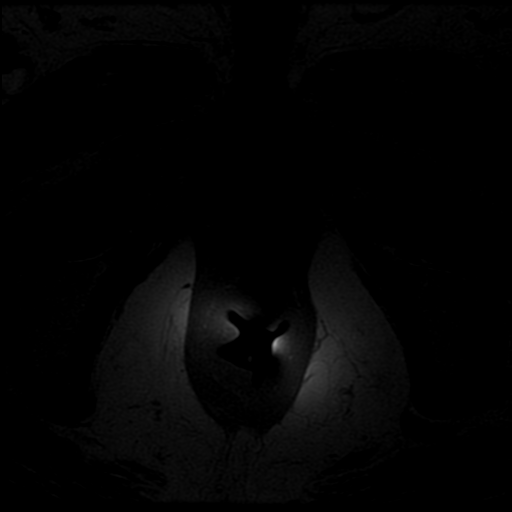
[im 5/30]
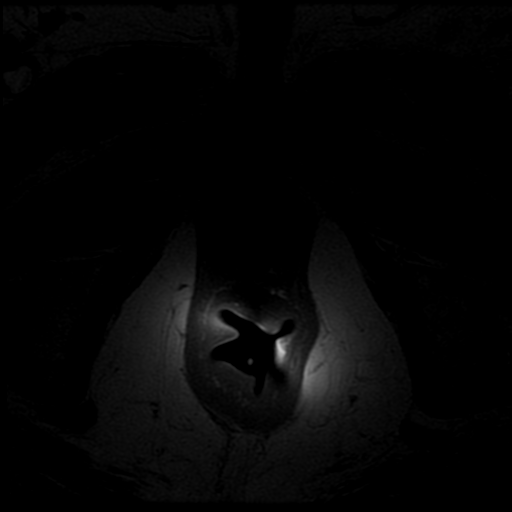
[im 6/30]
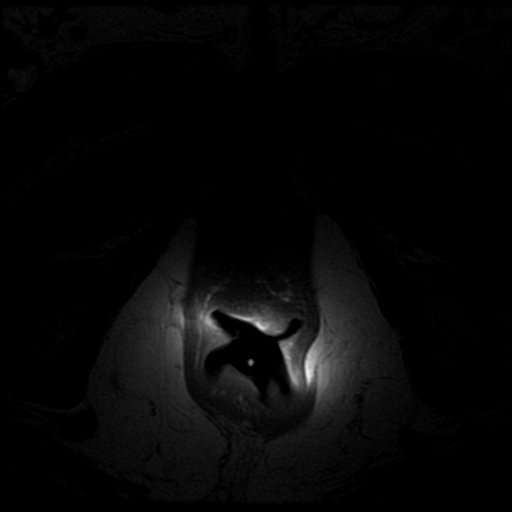
[im 7/30]
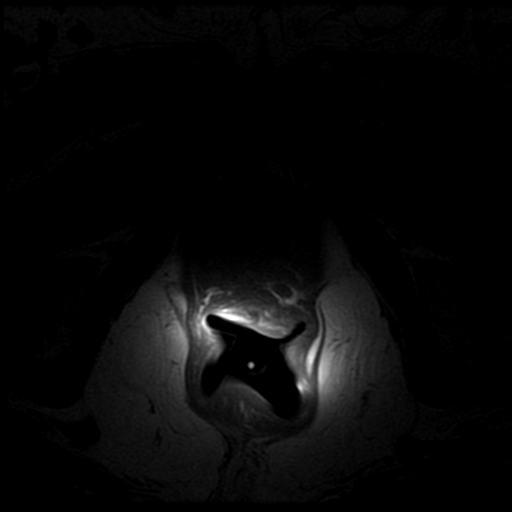
[im 8/30]
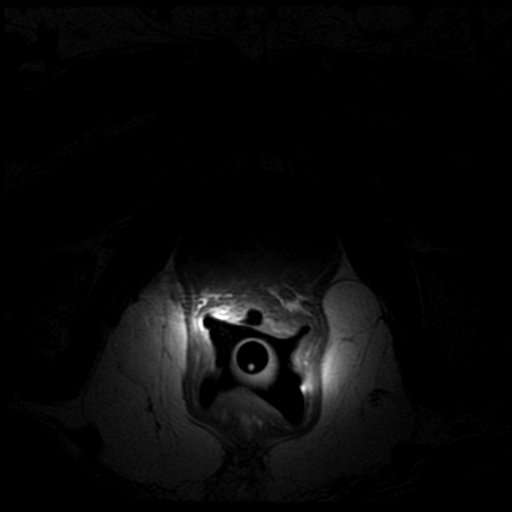
[im 9/30]
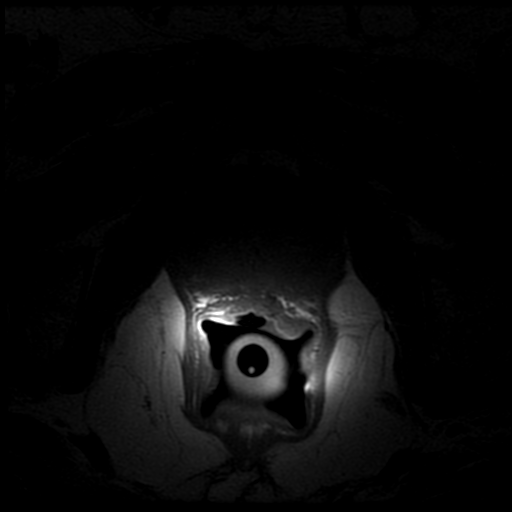
[im 10/30]
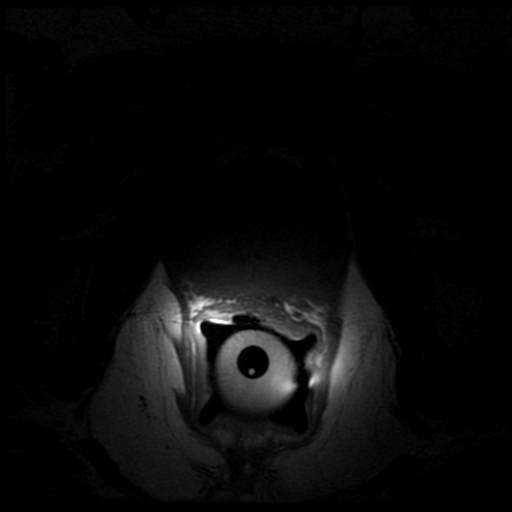
[im 11/30]
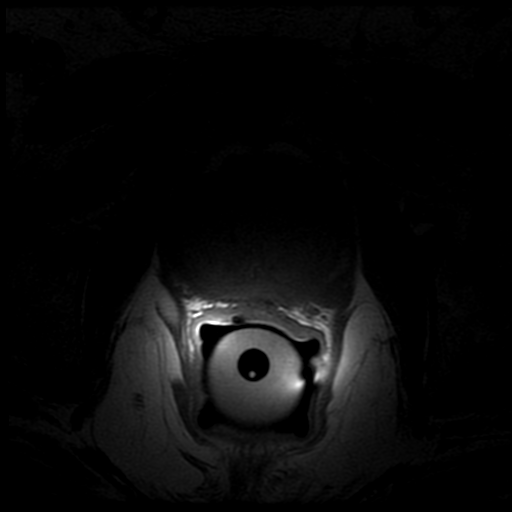
[im 12/30]
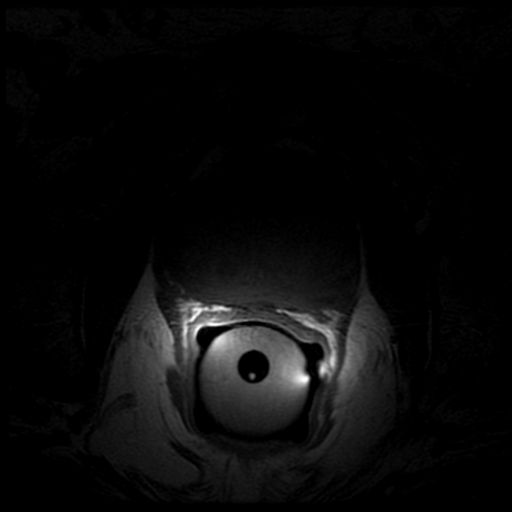
[im 14/30]
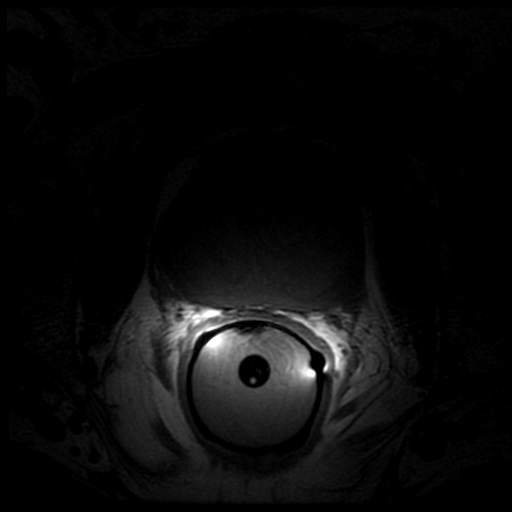
[im 16/30]
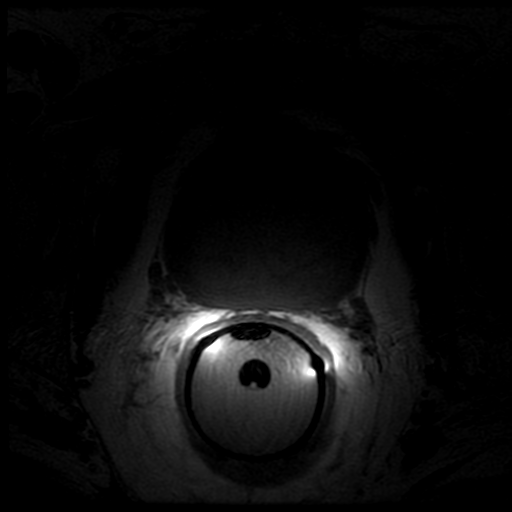
[im 17/30]
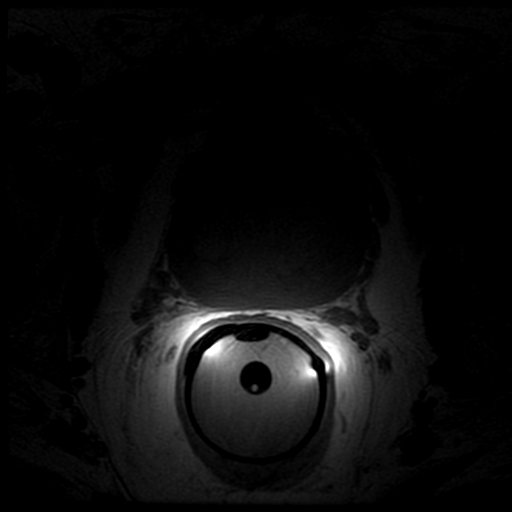
[im 21/30]
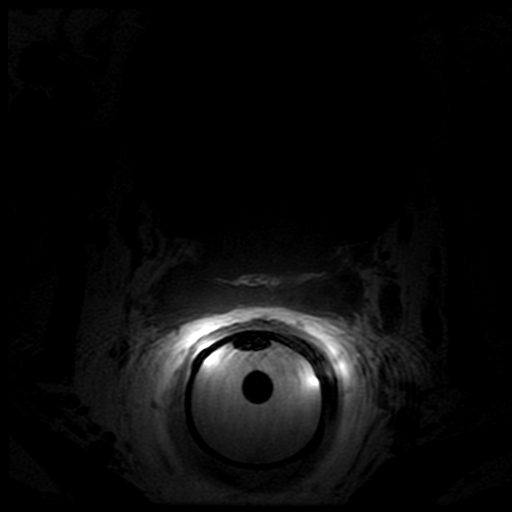
[im 25/30]
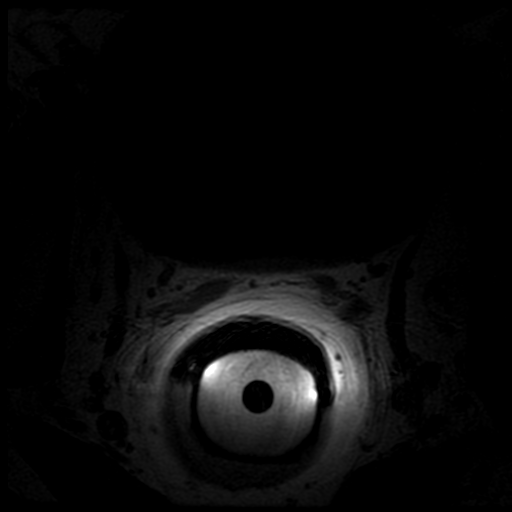
[im 26/30]
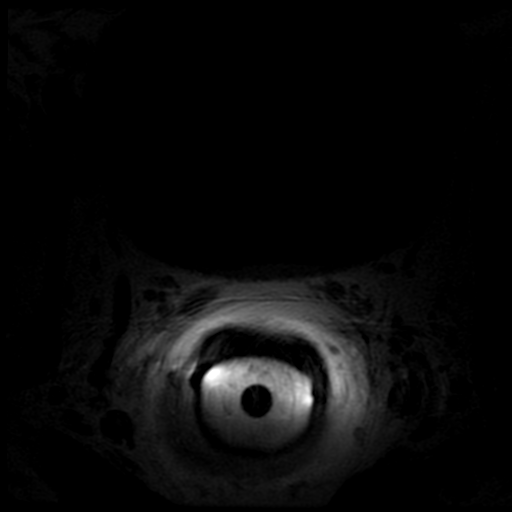
[im 29/30]
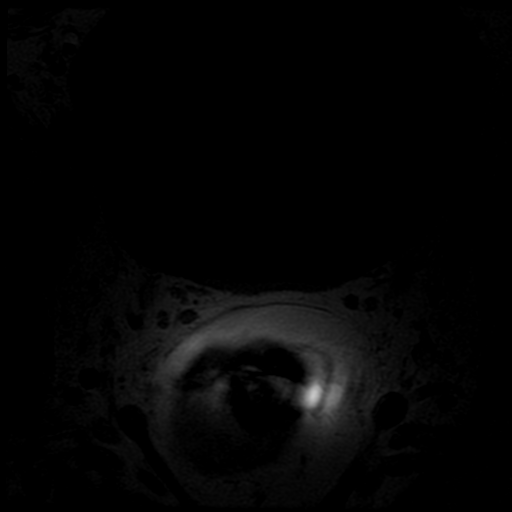

[19 of 30 positions shown; findings below may reference images not displayed]

FINDINGS: Prostate: Demonstrates moderate central gland enlargement and
heterogeneity, consistent with benign prostatic hyperplasia.
Peripheral zone is primarily compressed, without focal T2 signal
abnormality to suggest carcinoma. No areas of restricted diffusion
identified.

Transcapsular spread:  Absent

Seminal vesicle involvement: Absent

Neurovascular bundle involvement: Absent

Pelvic adenopathy: Absent

Bone metastasis: Absent

Other findings: No significant free fluid. Mild bladder distension
with similar distal bilateral hydroureter. Normal pelvic bowel
loops.
IMPRESSION: 1. Benign prostatic hyperplasia. No evidence of macroscopic or
high-grade carcinoma.
2. Bladder distension and likely secondary distal hydroureter, again
suggesting a component of bladder outlet obstruction.

## 2017-08-05 ENCOUNTER — Encounter: Payer: Self-pay | Admitting: Cardiovascular Disease

## 2017-08-05 ENCOUNTER — Ambulatory Visit: Payer: Medicare Other | Admitting: Cardiovascular Disease

## 2017-08-05 VITALS — BP 153/71 | HR 88 | Ht 71.0 in | Wt 222.6 lb

## 2017-08-05 DIAGNOSIS — I10 Essential (primary) hypertension: Secondary | ICD-10-CM

## 2017-08-05 DIAGNOSIS — E7800 Pure hypercholesterolemia, unspecified: Secondary | ICD-10-CM

## 2017-08-05 DIAGNOSIS — G4733 Obstructive sleep apnea (adult) (pediatric): Secondary | ICD-10-CM | POA: Diagnosis not present

## 2017-08-05 NOTE — Patient Instructions (Signed)

## 2017-08-05 NOTE — Progress Notes (Signed)
08/05/2017 Randy Ford   06/19/1940  [id number]  Primary Physician Randy Carol, MD Primary Cardiologist: Randy Harp MD Randy Ford, [place]  HPI:  Randy Ford is a 77 y.o.  mildly overweight married Caucasian male with no children. I last saw him in the office  08/23/16. He has a history of hypertension, hyperlipidemia and obstructive sleep apnea on C Pap. He had a Myoview stress test performed on 06/24/2008 which was negative. His lipid profile followed by his primary care physician Dr. Delfina Ford during he took an excessive amount of oral iron pills one month ago in attempt to increase his hemoglobin prior to voluntary blood donation after which he developed substernal chest pain which has been constant. His primary care physician thought this could either be "ischemia, reflux, or musculoskeletal. I performed a Myoview stress test on him 08/16/14 which was entirely remote normal as was a 2 week event monitor performed because of palpitations. Since I saw him a year ago he's remained asymptomatic. He did spent 2 weeks in [place] on vacation in May of this year      Current Meds  Medication Sig  . amLODipine (NORVASC) 5 MG tablet Take 5 mg by mouth daily.  . cyanocobalamin ([employer] VITAMIN B-12) 500 MCG tablet Take 500 mcg by mouth daily.  . folic acid (FOLVITE) 073 MCG tablet Take 400 mcg by mouth daily.  Marland Kitchen levothyroxine (SYNTHROID, LEVOTHROID) 75 MCG tablet Take 75 mcg by mouth daily.    . Magnesium 250 MG TABS Take 250 mg by mouth daily.  . mirtazapine (REMERON) 15 MG tablet   . Potassium 99 MG TABS Take 1 tablet by mouth daily.  . simvastatin (ZOCOR) 20 MG tablet Take 20 mg by mouth daily.   . Vitamin D, Cholecalciferol, 1000 UNITS TABS Take 1 tablet by mouth daily.     Allergies  Allergen Reactions  . No Known Allergies     Social History   Socioeconomic History  . Marital status: Married    Spouse name: Not on file  . Number of children: Not on file  . Years of  education: Not on file  . Highest education level: Not on file  Social Needs  . Financial resource strain: Not on file  . Food insecurity - worry: Not on file  . Food insecurity - inability: Not on file  . Transportation needs - medical: Not on file  . Transportation needs - non-medical: Not on file  Occupational History  . Occupation: Retired    Comment: Pharmacist, hospital  Tobacco Use  . Smoking status: Never Smoker  . Smokeless tobacco: Never Used  Substance and Sexual Activity  . Alcohol use: Yes    Comment: rarely  . Drug use: No  . Sexual activity: Not on file  Other Topics Concern  . Not on file  Social History Narrative  . Not on file     Review of Systems: General: negative for chills, fever, night sweats or weight changes.  Cardiovascular: negative for chest pain, dyspnea on exertion, edema, orthopnea, palpitations, paroxysmal nocturnal dyspnea or shortness of breath Dermatological: negative for rash Respiratory: negative for cough or wheezing Urologic: negative for hematuria Abdominal: negative for nausea, vomiting, diarrhea, bright red blood per rectum, melena, or hematemesis Neurologic: negative for visual changes, syncope, or dizziness All other systems reviewed and are otherwise negative except as noted above.    Blood pressure (!) 153/71, pulse 88, height 5\' 11"  (1.803 m), weight 222 lb 9.6 oz (101  kg).  General appearance: alert and no distress Neck: no adenopathy, no carotid bruit, no JVD, supple, symmetrical, trachea midline and thyroid not enlarged, symmetric, no tenderness/mass/nodules Lungs: clear to auscultation bilaterally Heart: regular rate and rhythm, S1, S2 normal, no murmur, click, rub or gallop Extremities: extremities normal, atraumatic, no cyanosis or edema Pulses: 2+ and symmetric Skin: Skin color, texture, turgor normal. No rashes or lesions Neurologic: Alert and oriented X 3, normal strength and tone. Normal symmetric reflexes. Normal coordination  and gait  EKG sinus rhythm at 78 with nonspecific ST and T-wave changes. I personally reviewed this EKG  ASSESSMENT AND PLAN:   Essential hypertension History of essential hypertension with blood pressure measures at 153/71. He is on amlodipine. Continue current meds at current dosing  Hyperlipidemia History of hyperlipidemia on statin therapy followed by his PCP  Obstructive sleep apnea History of obstructive sleep apnea on CPAP which he benefits from      Randy Harp MD [facility], [facility] 08/05/2017 1:33 PM

## 2017-08-05 NOTE — Assessment & Plan Note (Signed)
History of obstructive sleep apnea on CPAP which he benefits from 

## 2017-08-05 NOTE — Assessment & Plan Note (Signed)
History of essential hypertension with blood pressure measures at 153/71. He is on amlodipine. Continue current meds at current dosing

## 2017-08-05 NOTE — Assessment & Plan Note (Signed)
History of hyperlipidemia on statin therapy followed by his PCP 

## 2018-08-25 ENCOUNTER — Telehealth: Payer: Self-pay | Admitting: *Deleted

## 2018-08-25 NOTE — Telephone Encounter (Signed)
   Cardiac Questionnaire:    Since your last visit or hospitalization:    1. Have you been having new or worsening chest pain? no   2. Have you been having new or worsening shortness of breath? no 3. Have you been having new or worsening leg swelling, wt gain, or increase in abdominal girth (pants fitting more tightly)? no   4. Have you had any passing out spells? no    *A YES to any of these questions would result in the appointment being kept. *If all the answers to these questions are NO, we should indicate that given the current situation regarding the worldwide coronarvirus pandemic, at the recommendation of the CDC, we are looking to limit gatherings in our waiting area, and thus will reschedule their appointment beyond four weeks from today.   _____________   Recall placed for 6 months per dr berry

## 2018-08-26 ENCOUNTER — Ambulatory Visit: Payer: Medicare Other | Admitting: Cardiovascular Disease

## 2018-09-30 ENCOUNTER — Telehealth: Payer: Self-pay | Admitting: Adult Health

## 2018-09-30 ENCOUNTER — Telehealth: Payer: Self-pay | Admitting: Cardiovascular Disease

## 2018-09-30 NOTE — Telephone Encounter (Signed)
Wife's [phone]/ consent/ my chart/ pre reg completed

## 2018-09-30 NOTE — Telephone Encounter (Signed)
Thank you! ° °KL °

## 2018-09-30 NOTE — Telephone Encounter (Signed)
Discomfort in middle chest since last night that feels like a muscle tear, described as "soreness" rated 3/10. Patient says it not pain. Took 2 aspirin 325 mg today for headache but did not help the discomfort in his chest. Denies dizziness, sob, fatigue, coughing, congestion, fever, n/v or sob. Denies recent heavy lifting or pulling heavy objects. Denies eating spicy foods but did have pork with homemade barbecue pork last night. Virtual visit scheduled for 10/01/2018 @10 :00 am. Aware to have weight available, says they are unable to check BP & HR. Advised if symptoms got worse, to go to the ED for an evaluation. Verbalized understanding.   Patient verbally consented for tele-health visits today with [facility] and understands that his insurance company will be billed for the encounter.

## 2018-09-30 NOTE — Telephone Encounter (Signed)
New Message:    Patient calling to a appt or speak with a nurse. Patient states he is having some chest discomfort in his chest. Please call patient back.

## 2018-10-01 ENCOUNTER — Other Ambulatory Visit: Payer: Self-pay

## 2018-10-01 ENCOUNTER — Telehealth (INDEPENDENT_AMBULATORY_CARE_PROVIDER_SITE_OTHER): Payer: Medicare Other | Admitting: Adult Health

## 2018-10-01 VITALS — Ht 71.0 in | Wt 222.0 lb

## 2018-10-01 DIAGNOSIS — R0789 Other chest pain: Secondary | ICD-10-CM

## 2018-10-01 DIAGNOSIS — I10 Essential (primary) hypertension: Secondary | ICD-10-CM

## 2018-10-01 DIAGNOSIS — E7800 Pure hypercholesterolemia, unspecified: Secondary | ICD-10-CM

## 2018-10-01 MED ORDER — AMLODIPINE BESYLATE 5 MG PO TABS: 5.0000 mg | ORAL_TABLET | Freq: Every day | ORAL | 3 refills | Status: DC

## 2018-10-01 MED ORDER — SIMVASTATIN 20 MG PO TABS: 20.0000 mg | ORAL_TABLET | Freq: Every day | ORAL | 3 refills | Status: DC

## 2018-10-01 NOTE — Patient Instructions (Signed)
Medication Instructions:  Randy Sims, DNP recommends that you continue on your current medications as directed. Please refer to the Current Medication list given to you today.  If you need a refill on your cardiac medications before your next appointment, please call your pharmacy.   Follow-Up: At [facility] [place] [facility], you and your health needs are our priority.  As part of our continuing mission to provide you with exceptional heart care, we have created designated Provider Care Teams.  These Care Teams include your primary Cardiologist (physician) and Advanced Practice Providers (APPs -  Physician Assistants and Nurse Practitioners) who all work together to provide you with the care you need, when you need it. You will need a follow up appointment in 6 months.  Please call our office 2 months in advance to schedule this appointment.  You may see Quay Burow, MD or one of the following Advanced Practice Providers on your designated Care Team:   Kerin Ransom, PA-C Roby Lofts, [place] . Sande Rives, PA-C . You will receive a reminder letter in the mail two months in advance. If you don't receive a letter, please call our office to schedule the follow-up appointment.

## 2018-10-01 NOTE — Progress Notes (Signed)
Virtual Visit via Video Note   This visit type was conducted due to national recommendations for restrictions regarding the COVID-19 Pandemic (e.g. social distancing) in an effort to limit this patient's exposure and mitigate transmission in our community.  Due to his co-morbid illnesses, this patient is at least at moderate risk for complications without adequate follow up.  This format is felt to be most appropriate for this patient at this time.  All issues noted in this document were discussed and addressed.  A limited physical exam was performed with this format.  Please refer to the patient's chart for his consent to telehealth for [facility]   Patient has given verbal permission to conduct this visit via virtual appointment and to bill insurance 10/01/2018 10:03 AM     Evaluation Performed:  Follow-up visit  Date:  10/01/2018   ID:  Randy Ford, DOB 1940-09-03, MRN [id number]  Patient Location: Home Provider Location: Home  PCP:  Seward Carol, MD Cardiologist:  Dr.Berry  Electrophysiologist:  None   Chief Complaint:  Chest Pain   History of Present Illness:    Randy Ford is a 78 y.o. male with we are following for hypertension, hyperlipidemia, OSA on CPAP. Myoview on 08/16/2014 was negative for ischemia. He was last seen by Dr. Gwenlyn Found on 08/05/2017 and was doing well. He was continued on current medication regimen without new testing planned.   He called our office on 09/30/2018 complaining of muscular pain in his chest, described as soreness, with associated dyspnea, dizziness or weakness He admitted to eating spiced foods earlier that day with pork and BBQ. He does not have the availability of home BP cuff.   He reports that the pain is more of a soreness located at the left lateral sternal boarder area. Constant, non radiating with no associated dizziness, dyspnea or diaphoresis.  Pain is also elicited with palpation. Denies GERD symptoms. Pain is better today.   The  patient does not have symptoms concerning for COVID-19 infection (fever, chills, cough, or new shortness of breath).    Past Medical History:  Diagnosis Date  . Anemia   . Arthritis    osteoarthritis rt foot; gout  . Chest pain    denies presently; myoview,holter monitor wnl; Dr Gwenlyn Found cardiologist  . Dysuria-frequency syndrome   . Eczema   . Foley catheter in place   . High cholesterol   . History of urinary tract infection   . Hydronephrosis, bilateral   . Hyperlipidemia   . Hypertension   . Hypothyroidism   . IBS (irritable bowel syndrome)   . Nocturia   . OSA (obstructive sleep apnea) 08/08/2008   AHI 1.09/hr  . Tendonitis, Achilles, right   . Wears glasses    Past Surgical History:  Procedure Laterality Date  . benign lipomas on arms  Bilateral    removed 2016  . CATARACT EXTRACTION     x2  . CYSTOSCOPY W/ URETERAL STENT PLACEMENT Bilateral 10/06/2015   Procedure: CYSTOSCOPY WITH BILATERAL RETROGRADE PYELOGRAM BILATERAL URETERAL STENT PLACEMENT WITH LEFT URETEROSCOPY;  Surgeon: Ardis Hughs, MD;  Location: [facility];  Service: Urology;  Laterality: Bilateral;  . EYE SURGERY Bilateral    detached retina; 3 on left eye; once on right  . MASS EXCISION Bilateral    fatty tumors rmoved from arms  . NASAL SEPTOPLASTY W/ TURBINOPLASTY Bilateral 05/15/2016   Procedure: NASAL SEPTOPLASTY WITH BILATERAL INFERIOR TURBINATE REDUCTION;  Surgeon: Jerrell Belfast, MD;  Location: [facility];  Service:  ENT;  Laterality: Bilateral;  . PROSTATECTOMY N/A 12/15/2015   Procedure: OPEN SIMPLE PROSTATECTOMY;  Surgeon: Ardis Hughs, MD;  Location: [facility];  Service: Urology;  Laterality: N/A;  . TONSILLECTOMY  1948  . TONSILLECTOMY       Current Meds  Medication Sig  . amLODipine (NORVASC) 5 MG tablet Take 5 mg by mouth daily.  . Cyanocobalamin (VITAMIN B 12 PO) Take 1,000 mcg by mouth daily.  Marland Kitchen levothyroxine (SYNTHROID, LEVOTHROID) 75 MCG tablet Take 75 mcg by mouth  daily.    . Magnesium 250 MG TABS Take 250 mg by mouth daily.  . Melatonin 5 MG TABS Take 1 tablet by mouth at bedtime as needed.  . mirtazapine (REMERON) 15 MG tablet Take 15 mg by mouth at bedtime as needed.   . polyethylene glycol (MIRALAX / GLYCOLAX) 17 g packet Take 17 g by mouth daily as needed.  . Potassium 99 MG TABS Take 1 tablet by mouth daily.  . simvastatin (ZOCOR) 20 MG tablet Take 20 mg by mouth daily.   . tamsulosin (FLOMAX) 0.4 MG CAPS capsule Take 0.4 mg by mouth daily.  . Vitamin D, Cholecalciferol, 1000 UNITS TABS Take 1 tablet by mouth daily.     Allergies:   Patient has no known allergies.   Social History   Tobacco Use  . Smoking status: Never Smoker  . Smokeless tobacco: Never Used  Substance Use Topics  . Alcohol use: Yes    Comment: rarely  . Drug use: No     Family Hx: The patient's family history includes Cancer in his father and mother; Dementia in his mother.  ROS:   Please see the history of present illness.    All other systems reviewed and are negative.   Prior CV studies:   The following studies were reviewed today:  NM Stress Test 08/16/2014 Impression Exercise Capacity:  Good exercise capacity. BP Response:  There is question of hypotensive response with peak exercise (SBP decreased from 159 to 118); however, immediate post exercise SBP 194 and therefore doubt 118 was accurate. Clinical Symptoms:  There is dyspnea. ECG Impression:  Insignificant upsloping ST segment depression. Comparison with Prior Nuclear Study: No previous nuclear study performed  Overall Impression:  Normal stress nuclear study.  Labs/Other Tests and Data Reviewed:    EKG:  No ECG reviewed.  Recent Labs: No results found for requested labs within last 8760 hours.   Recent Lipid Panel No results found for: CHOL, TRIG, HDL, CHOLHDL, LDLCALC, LDLDIRECT  Wt Readings from Last 3 Encounters:  10/01/18 222 lb (100.7 kg)  08/05/17 222 lb 9.6 oz (101 kg)   08/23/16 210 lb (95.3 kg)     Objective:    Vital Signs:  Ht 5\' 11"  (1.803 m)   Wt 222 lb (100.7 kg)   BMI 30.96 kg/m    VITAL SIGNS:  reviewed  He is awake alert oriented.  No acute distress No dyspnea when speaking Pain with palpation of left sternal boarder (at my request when I saw him on video). Affect is normal.   ASSESSMENT & PLAN:    1. Atypical Chest Pain:  Appears more musculoskeletal via limited assessment via video visit. Pain is reproducible with palpation and not associated with any additional symptoms. He actually reports that the pain is better today. Will not plan any new testing at this time unless symptoms return and appear to be more cardiac in etiology.   Last stress test in 2016 was negative for  ischemia. With risk factors for CAD to include AGE, Obesity, HTN, and HL, he will need to be seen in 6 months at which time further testing may be recommended for ongoing assessment at the discretion of Dr. Gwenlyn Found.  2. Hypertension: He has a home BP monitor that reads about 30 points higher than when he has this taken in the provider offices. He does not think it is accurate and therefore does not take it regularly. In providers office he reports BP 818;E systolic. He will continue on amlodipine 5 mg as directed.   3. Hypercholesterolemia: Continues on Simvastatin 20 mg daily. Labs are followed by PCP   COVID-19 Education: The signs and symptoms of COVID-19 were discussed with the patient and how to seek care for testing (follow up with PCP or arrange E-visit).  The importance of social distancing was discussed today.  Time:   Today, I have spent 15  minutes with the patient with telehealth technology discussing the above problems.     Medication Adjustments/Labs and Tests Ordered: Current medicines are reviewed at length with the patient today.  Concerns regarding medicines are outlined above.   Tests Ordered: No orders of the defined types were placed in this  encounter.   Medication Changes: No orders of the defined types were placed in this encounter.   Disposition:  Follow up 6 months Signed, Phill Myron. West Pugh, ANP, AACC  10/01/2018 9:29 AM      [facility]

## 2018-12-16 ENCOUNTER — Telehealth: Payer: Self-pay | Admitting: *Deleted

## 2018-12-16 NOTE — Telephone Encounter (Signed)
A message was left, re: follow up visit. 

## 2019-03-05 ENCOUNTER — Other Ambulatory Visit: Payer: Self-pay

## 2019-03-05 ENCOUNTER — Ambulatory Visit: Payer: Medicare Other | Admitting: Cardiovascular Disease

## 2019-03-05 ENCOUNTER — Encounter: Payer: Self-pay | Admitting: Cardiovascular Disease

## 2019-03-05 DIAGNOSIS — I10 Essential (primary) hypertension: Secondary | ICD-10-CM

## 2019-03-05 DIAGNOSIS — E782 Mixed hyperlipidemia: Secondary | ICD-10-CM | POA: Diagnosis not present

## 2019-03-05 NOTE — Progress Notes (Signed)
03/05/2019 Randy Ford   16-Sep-1940  [id number]  Primary Physician Seward Carol, MD Primary Cardiologist: Lorretta Harp MD Randy Ford, [place]  HPI:  Randy Ford is a 78 y.o.  mildly overweight married Caucasian male with no children. I last saw him in the office  08/05/2017. He has a history of hypertension, hyperlipidemia and obstructive sleep apnea on C Pap. He had a Myoview stress test performed on 06/24/2008 which was negative. His lipid profile followed by his primary care physician Dr. Delfina Redwood during he took an excessive amount of oral iron pills one month ago in attempt to increase his hemoglobin prior to voluntary blood donation after which he developed substernal chest pain which has been constant. His primary care physician thought this could either be "ischemia, reflux, or musculoskeletal. I performed a Myoview stress test on him 08/16/14 which was entirely remote normal as was a 2 week event monitor performed because of palpitations.   Since I saw him a year and a half ago he is done well.  He did see Randy Sims, NP virtually 10/01/2018 for some atypical sternal chest pain which was thought to be musculoskeletal.   He has had no recurrent symptoms.   Current Meds  Medication Sig  . amLODipine (NORVASC) 5 MG tablet Take 1 tablet (5 mg total) by mouth daily.  . Cyanocobalamin (VITAMIN B 12 PO) Take 1,000 mcg by mouth daily.  Marland Kitchen levothyroxine (SYNTHROID, LEVOTHROID) 75 MCG tablet Take 75 mcg by mouth daily.    . Melatonin 5 MG TABS Take 1 tablet by mouth at bedtime as needed.  . mirtazapine (REMERON) 15 MG tablet Take 15 mg by mouth at bedtime as needed.   . polyethylene glycol (MIRALAX / GLYCOLAX) 17 g packet Take 17 g by mouth daily as needed.  . Potassium 99 MG TABS Take 1 tablet by mouth daily.  . simvastatin (ZOCOR) 20 MG tablet Take 1 tablet (20 mg total) by mouth daily.  . Vitamin D, Cholecalciferol, 1000 UNITS TABS Take 1 tablet by mouth daily.  .  [DISCONTINUED] Magnesium 250 MG TABS Take 250 mg by mouth daily.  . [DISCONTINUED] tamsulosin (FLOMAX) 0.4 MG CAPS capsule Take 0.4 mg by mouth daily.     No Known Allergies  Social History   Socioeconomic History  . Marital status: Married    Spouse name: Not on file  . Number of children: Not on file  . Years of education: Not on file  . Highest education level: Not on file  Occupational History  . Occupation: Retired    Comment: Tour manager  . Financial resource strain: Not on file  . Food insecurity    Worry: Not on file    Inability: Not on file  . Transportation needs    Medical: Not on file    Non-medical: Not on file  Tobacco Use  . Smoking status: Never Smoker  . Smokeless tobacco: Never Used  Substance and Sexual Activity  . Alcohol use: Yes    Comment: rarely  . Drug use: No  . Sexual activity: Not on file  Lifestyle  . Physical activity    Days per week: Not on file    Minutes per session: Not on file  . Stress: Not on file  Relationships  . Social Herbalist on phone: Not on file    Gets together: Not on file    Attends religious service: Not on file    Active  member of club or organization: Not on file    Attends meetings of clubs or organizations: Not on file    Relationship status: Not on file  . Intimate partner violence    Fear of current or ex partner: Not on file    Emotionally abused: Not on file    Physically abused: Not on file    Forced sexual activity: Not on file  Other Topics Concern  . Not on file  Social History Narrative  . Not on file     Review of Systems: General: negative for chills, fever, night sweats or weight changes.  Cardiovascular: negative for chest pain, dyspnea on exertion, edema, orthopnea, palpitations, paroxysmal nocturnal dyspnea or shortness of breath Dermatological: negative for rash Respiratory: negative for cough or wheezing Urologic: negative for hematuria Abdominal: negative for  nausea, vomiting, diarrhea, bright red blood per rectum, melena, or hematemesis Neurologic: negative for visual changes, syncope, or dizziness All other systems reviewed and are otherwise negative except as noted above.    Blood pressure (!) 150/70, pulse 77, temperature 97.7 F (36.5 C), height 5\' 11"  (1.803 m), weight 219 lb 12.8 oz (99.7 kg), SpO2 96 %.  General appearance: alert and no distress Neck: no adenopathy, no carotid bruit, no JVD, supple, symmetrical, trachea midline and thyroid not enlarged, symmetric, no tenderness/mass/nodules Lungs: clear to auscultation bilaterally Heart: regular rate and rhythm, S1, S2 normal, no murmur, click, rub or gallop Extremities: extremities normal, atraumatic, no cyanosis or edema Pulses: 2+ and symmetric Skin: Skin color, texture, turgor normal. No rashes or lesions Neurologic: Alert and oriented X 3, normal strength and tone. Normal symmetric reflexes. Normal coordination and gait  EKG NSR at 69. I personally reviewed this EKG  ASSESSMENT AND PLAN:   Essential hypertension History of essential hypertension with blood pressure measured today at 150/78 although normally it is lower than this.  He is on amlodipine.  Hyperlipidemia History of hyperlipidemia on simvastatin.  We will recheck a lipid liver profile.  Obstructive sleep apnea History of obstructive sleep apnea on CPAP      Lorretta Harp MD [facility], [facility] 03/05/2019 4:09 PM

## 2019-03-05 NOTE — Assessment & Plan Note (Signed)
History of hyperlipidemia on simvastatin.  We will recheck a lipid liver profile. 

## 2019-03-05 NOTE — Assessment & Plan Note (Signed)
History of obstructive sleep apnea on CPAP. 

## 2019-03-05 NOTE — Patient Instructions (Addendum)
Medication Instructions:  Your physician recommends that you continue on your current medications as directed. Please refer to the Current Medication list given to you today.  If you need a refill on your cardiac medications before your next appointment, please call your pharmacy.   Lab work: Your physician recommends that you return for lab work within 1 week: [place]  If you have labs (blood work) drawn today and your tests are completely normal, you will receive your results only by: Marland Kitchen MyChart Message (if you have MyChart) OR . A paper copy in the mail If you have any lab test that is abnormal or we need to change your treatment, we will call you to review the results.  Testing/Procedures: NONE  Follow-Up: At [facility], you and your health needs are our priority.  As part of our continuing mission to provide you with exceptional heart care, we have created designated Provider Care Teams.  These Care Teams include your primary Cardiologist (physician) and Advanced Practice Providers (APPs -  Physician Assistants and Nurse Practitioners) who all work together to provide you with the care you need, when you need it. You will need a follow up appointment in 12 months with Dr. Quay Burow.  Please call our office 2 months in advance to schedule this appointment.

## 2019-03-05 NOTE — Assessment & Plan Note (Signed)
History of essential hypertension with blood pressure measured today at 150/78 although normally it is lower than this.  He is on amlodipine.

## 2019-03-08 LAB — LIPID PANEL
Chol/HDL Ratio: 5 ratio (ref 0.0–5.0)
Cholesterol, Total: 200 mg/dL — ABNORMAL HIGH (ref 100–199)
HDL: 40 mg/dL (ref >39)
LDL Chol Calc (NIH): 112 mg/dL — ABNORMAL HIGH (ref 0–99)
Triglycerides: 275 mg/dL — ABNORMAL HIGH (ref 0–149)
VLDL Cholesterol Cal: 48 mg/dL — ABNORMAL HIGH (ref 5–40)

## 2019-03-08 LAB — HEPATIC FUNCTION PANEL
ALT: 25 IU/L (ref 0–44)
AST: 23 IU/L (ref 0–40)
Albumin: 4.6 g/dL (ref 3.7–4.7)
Alkaline Phosphatase: 127 IU/L — ABNORMAL HIGH (ref 39–117)
Bilirubin Total: 0.3 mg/dL (ref 0.0–1.2)
Bilirubin, Direct: 0.1 mg/dL (ref 0.00–0.40)
Total Protein: 7.3 g/dL (ref 6.0–8.5)

## 2019-06-15 ENCOUNTER — Ambulatory Visit: Payer: Medicare Other | Attending: Internal Medicine

## 2019-06-15 DIAGNOSIS — Z23 Encounter for immunization: Secondary | ICD-10-CM | POA: Insufficient documentation

## 2019-06-15 NOTE — Progress Notes (Signed)
   Covid-19 Vaccination Clinic  Name:  Randy Ford    MRN: [id number] DOB: 1941-04-07  06/15/2019  [name] was observed post Covid-19 immunization for 15 minutes without incidence. He was provided with Vaccine Information Sheet and instruction to access the V-Safe system.   [name] was instructed to call 911 with any severe reactions post vaccine: Marland Kitchen Difficulty breathing  . Swelling of your face and throat  . A fast heartbeat  . A bad rash all over your body  . Dizziness and weakness    Immunizations Administered    Name Date Dose VIS Date Route   Pfizer COVID-19 Vaccine 06/15/2019  9:24 AM 0.3 mL 05/14/2019 Intramuscular   Manufacturer: [place]   Lot: [id number]   [place]: [id number]

## 2019-07-05 ENCOUNTER — Ambulatory Visit: Payer: Medicare Other | Attending: Internal Medicine

## 2019-07-05 DIAGNOSIS — Z23 Encounter for immunization: Secondary | ICD-10-CM | POA: Insufficient documentation

## 2019-07-05 NOTE — Progress Notes (Signed)
   Covid-19 Vaccination Clinic  Name:  Randy Ford    MRN: [id number] DOB: [birth year]  07/05/2019  [name] was observed post Covid-19 immunization for 15 minutes without incidence. He was provided with Vaccine Information Sheet and instruction to access the V-Safe system.   [name] was instructed to call 911 with any severe reactions post vaccine: Marland Kitchen Difficulty breathing  . Swelling of your face and throat  . A fast heartbeat  . A bad rash all over your body  . Dizziness and weakness

## 2019-10-11 ENCOUNTER — Other Ambulatory Visit: Payer: Self-pay | Admitting: Adult Health

## 2019-12-24 ENCOUNTER — Other Ambulatory Visit: Payer: Self-pay | Admitting: Internal Medicine

## 2019-12-24 ENCOUNTER — Ambulatory Visit
Admission: RE | Admit: 2019-12-24 | Discharge: 2019-12-24 | Disposition: A | Discharge status: Home / Self Care | Payer: Medicare Other | Source: Ambulatory Visit | Attending: Internal Medicine | Admitting: Internal Medicine

## 2019-12-24 DIAGNOSIS — R102 Pelvic and perineal pain: Secondary | ICD-10-CM

## 2020-01-05 ENCOUNTER — Encounter: Payer: Self-pay | Admitting: Gastroenterology

## 2020-01-10 ENCOUNTER — Telehealth: Payer: Self-pay | Admitting: Gastroenterology

## 2020-01-11 ENCOUNTER — Ambulatory Visit
Admission: RE | Admit: 2020-01-11 | Discharge: 2020-01-11 | Disposition: A | Discharge status: Home / Self Care | Payer: Medicare Other | Source: Ambulatory Visit | Attending: Internal Medicine | Admitting: Internal Medicine

## 2020-01-11 ENCOUNTER — Other Ambulatory Visit: Payer: Self-pay | Admitting: Internal Medicine

## 2020-01-11 DIAGNOSIS — R109 Unspecified abdominal pain: Secondary | ICD-10-CM

## 2020-01-11 DIAGNOSIS — K6389 Other specified diseases of intestine: Secondary | ICD-10-CM

## 2020-03-02 ENCOUNTER — Other Ambulatory Visit: Payer: Self-pay

## 2020-03-02 ENCOUNTER — Ambulatory Visit (AMBULATORY_SURGERY_CENTER): Payer: Self-pay

## 2020-03-02 VITALS — Ht 71.0 in | Wt 213.0 lb

## 2020-03-02 DIAGNOSIS — Z1211 Encounter for screening for malignant neoplasm of colon: Secondary | ICD-10-CM

## 2020-03-02 NOTE — Progress Notes (Signed)
No egg or soy allergy known to patient  No issues with past sedation with any surgeries or procedures No intubation problems in the past  No FH of Malignant Hyperthermia No diet pills per patient No home 02 use per patient  No blood thinners per patient  Pt reports issues with constipation --takes Miralax daily and advised to continue this along with the prep No A fib or A flutter  EMMI video via [place] 19 guidelines implemented in PV today with Pt and RN  COVID vaccines completed on 07/2019 per pt;  Due to the COVID-19 pandemic we are asking patients to follow these guidelines. Please only bring one care partner. Please be aware that your care partner may wait in the car in the parking lot or if they feel like they will be too hot to wait in the car, they may wait in the lobby on the 4th floor. All care partners are required to wear a mask the entire time (we do not have any that we can provide them), they need to practice social distancing, and we will do a Covid check for all patient's and care partners when you arrive. Also we will check their temperature and your temperature. If the care partner waits in their car they need to stay in the parking lot the entire time and we will call them on their cell phone when the patient is ready for discharge so they can bring the car to the front of the building. Also all patient's will need to wear a mask into building.

## 2020-03-03 ENCOUNTER — Encounter: Payer: Self-pay | Admitting: Gastroenterology

## 2020-03-07 ENCOUNTER — Encounter: Payer: Self-pay | Admitting: Cardiovascular Disease

## 2020-03-07 ENCOUNTER — Ambulatory Visit (INDEPENDENT_AMBULATORY_CARE_PROVIDER_SITE_OTHER): Payer: Medicare Other | Admitting: Cardiovascular Disease

## 2020-03-07 ENCOUNTER — Other Ambulatory Visit: Payer: Self-pay

## 2020-03-07 DIAGNOSIS — I10 Essential (primary) hypertension: Secondary | ICD-10-CM

## 2020-03-07 DIAGNOSIS — E782 Mixed hyperlipidemia: Secondary | ICD-10-CM | POA: Diagnosis not present

## 2020-03-07 DIAGNOSIS — G4733 Obstructive sleep apnea (adult) (pediatric): Secondary | ICD-10-CM

## 2020-03-07 NOTE — Assessment & Plan Note (Signed)
History of hyperlipidemia on statin therapy with lipid profile performed 12/02/2019 revealing total cholesterol 233, LDL 106 and HDL 41.

## 2020-03-07 NOTE — Assessment & Plan Note (Signed)
History of obstructive sleep apnea on CPAP. 

## 2020-03-07 NOTE — Progress Notes (Signed)
03/07/2020 Randy Ford   1941/02/10  [id number]  Primary Physician Seward Carol, MD Primary Cardiologist: Lorretta Harp MD Lupe Carney, Randy  Ford:  Randy Ford is a 79 y.o.  mildly overweight married Caucasian male with no children. I last saw him in the office  03/05/2019. He has a history of hypertension, hyperlipidemia and obstructive sleep apnea on C Pap. He had a Myoview stress test performed on 06/24/2008 which was negative. His lipid profile followed by his primary care physician Dr. Delfina Redwood during he took an excessive amount of oral iron pills one month ago in attempt to increase his hemoglobin prior to voluntary blood donation after which he developed substernal chest pain which has been constant. His primary care physician thought this could either be "ischemia, reflux, or musculoskeletal. I performed a Myoview stress test on him 08/16/14 which was entirely remote normal as was a 2 week event monitor performed because of palpitations.   Since I saw him a year ago he continues to do well.  He denies chest pain or shortness of breath.  Does complain of some arthritis and some bladder issues.  Scheduled for colonoscopy next week.   Current Meds  Medication Sig  . amLODipine (NORVASC) 5 MG tablet TAKE ONE TABLET EACH DAY  . Cyanocobalamin (VITAMIN B 12 PO) Take 1,000 mcg by mouth daily.  . diazepam (VALIUM) 10 MG tablet 10 mg daily as needed.  Marland Kitchen levothyroxine (SYNTHROID, LEVOTHROID) 75 MCG tablet Take 75 mcg by mouth daily.    . Melatonin 5 MG TABS Take 1 tablet by mouth at bedtime as needed.  . meloxicam (MOBIC) 15 MG tablet Take 15 mg by mouth at bedtime.  . mirtazapine (REMERON) 15 MG tablet Take 15 mg by mouth at bedtime as needed.   . polyethylene glycol (MIRALAX / GLYCOLAX) 17 g packet Take 17 g by mouth daily.   . Potassium 99 MG TABS Take 1 tablet by mouth daily.  . simvastatin (ZOCOR) 20 MG tablet TAKE ONE TABLET EACH DAY  . Vitamin D, Cholecalciferol, 1000  UNITS TABS Take 1 tablet by mouth daily.     No Known Allergies  Social History   Socioeconomic History  . Marital status: Married    Spouse name: Not on file  . Number of children: Not on file  . Years of education: Not on file  . Highest education level: Not on file  Occupational History  . Occupation: Retired    Comment: Pharmacist, hospital  Tobacco Use  . Smoking status: Never Smoker  . Smokeless tobacco: Never Used  Vaping Use  . Vaping Use: Never used  Substance and Sexual Activity  . Alcohol use: Not Currently  . Drug use: No  . Sexual activity: Not on file  Other Topics Concern  . Not on file  Social History Narrative  . Not on file   Social Determinants of Health   Financial Resource Strain:   . Difficulty of Paying Living Expenses: Not on file  Food Insecurity:   . Worried About Charity fundraiser in the Last Year: Not on file  . Ran Out of Food in the Last Year: Not on file  Transportation Needs:   . Lack of Transportation (Medical): Not on file  . Lack of Transportation (Non-Medical): Not on file  Physical Activity:   . Days of Exercise per Week: Not on file  . Minutes of Exercise per Session: Not on file  Stress:   . Feeling of  Stress : Not on file  Social Connections:   . Frequency of Communication with Friends and Family: Not on file  . Frequency of Social Gatherings with Friends and Family: Not on file  . Attends Religious Services: Not on file  . Active Member of Clubs or Organizations: Not on file  . Attends Archivist Meetings: Not on file  . Marital Status: Not on file  Intimate Partner Violence:   . Fear of Current or Ex-Partner: Not on file  . Emotionally Abused: Not on file  . Physically Abused: Not on file  . Sexually Abused: Not on file     Review of Systems: General: negative for chills, fever, night sweats or weight changes.  Cardiovascular: negative for chest pain, dyspnea on exertion, edema, orthopnea, palpitations, paroxysmal  nocturnal dyspnea or shortness of breath Dermatological: negative for rash Respiratory: negative for cough or wheezing Urologic: negative for hematuria Abdominal: negative for nausea, vomiting, diarrhea, bright red blood per rectum, melena, or hematemesis Neurologic: negative for visual changes, syncope, or dizziness All other systems reviewed and are otherwise negative except as noted above.    Blood pressure (!) 150/82, pulse 69, height 5\' 11"  (1.803 m), weight 212 lb (96.2 kg).  General appearance: alert and no distress Neck: no adenopathy, no carotid bruit, no JVD, supple, symmetrical, trachea midline and thyroid not enlarged, symmetric, no tenderness/mass/nodules Lungs: clear to auscultation bilaterally Heart: regular rate and rhythm, S1, S2 normal, no murmur, click, rub or gallop Extremities: extremities normal, atraumatic, no cyanosis or edema Pulses: 2+ and symmetric Skin: Skin color, texture, turgor normal. No rashes or lesions Neurologic: Alert and oriented X 3, normal strength and tone. Normal symmetric reflexes. Normal coordination and gait  EKG sinus rhythm at 69 without ST or T wave changes.  I personally reviewed this EKG.  ASSESSMENT AND PLAN:   Essential hypertension History of essential hypertension a blood pressure measured today 150/82.  He is on amlodipine.  Hyperlipidemia History of hyperlipidemia on statin therapy with lipid profile performed 12/02/2019 revealing total cholesterol 233, LDL 106 and HDL 41.  Obstructive sleep apnea History of obstructive sleep apnea on CPAP      Lorretta Harp MD [facility], [facility] 03/07/2020 3:53 PM

## 2020-03-07 NOTE — Assessment & Plan Note (Signed)
History of essential hypertension a blood pressure measured today 150/82.  He is on amlodipine.

## 2020-03-07 NOTE — Patient Instructions (Addendum)
Medication Instructions:  °Your physician recommends that you continue on your current medications as directed. Please refer to the Current Medication list given to you today. ° °*If you need a refill on your cardiac medications before your next appointment, please call your pharmacy* ° °Lab Work: °NONE ordered at this time of appointment  ° °If you have labs (blood work) drawn today and your tests are completely normal, you will receive your results only by: °MyChart Message (if you have MyChart) OR °A paper copy in the mail °If you have any lab test that is abnormal or we need to change your treatment, we will call you to review the results. ° °Testing/Procedures: °NONE ordered at this time of appointment  ° °Follow-Up: °At [facility], you and your health needs are our priority.  As part of our continuing mission to provide you with exceptional heart care, we have created designated Provider Care Teams.  These Care Teams include your primary Cardiologist (physician) and Advanced Practice Providers (APPs -  Physician Assistants and Nurse Practitioners) who all work together to provide you with the care you need, when you need it. ° °Your next appointment:   °1 year(s) ° °The format for your next appointment:   °In Person ° °Provider:   °Jonathan Berry, MD   ° °Other Instructions ° ° °

## 2020-03-16 ENCOUNTER — Other Ambulatory Visit: Payer: Self-pay

## 2020-03-16 ENCOUNTER — Encounter: Payer: Self-pay | Admitting: Gastroenterology

## 2020-03-16 ENCOUNTER — Ambulatory Visit (AMBULATORY_SURGERY_CENTER): Payer: Medicare Other | Admitting: Gastroenterology

## 2020-03-16 VITALS — BP 133/76 | HR 60 | Temp 98.0°F | Resp 12 | Ht 71.0 in | Wt 213.0 lb

## 2020-03-16 DIAGNOSIS — D122 Benign neoplasm of ascending colon: Secondary | ICD-10-CM

## 2020-03-16 DIAGNOSIS — D124 Benign neoplasm of descending colon: Secondary | ICD-10-CM

## 2020-03-16 DIAGNOSIS — Z1211 Encounter for screening for malignant neoplasm of colon: Secondary | ICD-10-CM | POA: Diagnosis not present

## 2020-03-16 HISTORY — PX: COLONOSCOPY: SHX174

## 2020-03-16 MED ORDER — SODIUM CHLORIDE 0.9 % IV SOLN: 500.0000 mL | Freq: Once | INTRAVENOUS | Status: DC

## 2020-03-16 NOTE — Progress Notes (Signed)
Pt.reports no change in his medical or surgical history since his pre-visit 03/02/20.

## 2020-03-16 NOTE — Progress Notes (Signed)
A/ox3, pleased with MAC, report to RN 

## 2020-03-16 NOTE — Progress Notes (Signed)
Called to room to assist during endoscopic procedure.  Patient ID and intended procedure confirmed with present staff. Received instructions for my participation in the procedure from the performing physician.  

## 2020-03-16 NOTE — Patient Instructions (Signed)
HANDOUTS PROVIDED ON: High fiber diet, polyps, and hemorrhoids  The polyps removed today have been sent for pathology.  The results can take 1-3 weeks to receive.  When your next colonoscopy should occur will be based on the pathology results.    You may resume your previous diet and medication schedule. Use FiberCon 1-2 tablets daily.  Thank you for allowing [place] to care for you today!!!    YOU HAD AN ENDOSCOPIC PROCEDURE TODAY AT [facility]:   Refer to the procedure report that was given to you for any specific questions about what was found during the examination.  If the procedure report does not answer your questions, please call your gastroenterologist to clarify.  If you requested that your care partner not be given the details of your procedure findings, then the procedure report has been included in a sealed envelope for you to review at your convenience later.  YOU SHOULD EXPECT: Some feelings of bloating in the abdomen. Passage of more gas than usual.  Walking can help get rid of the air that was put into your GI tract during the procedure and reduce the bloating. If you had a lower endoscopy (such as a colonoscopy or flexible sigmoidoscopy) you may notice spotting of blood in your stool or on the toilet paper. If you underwent a bowel prep for your procedure, you may not have a normal bowel movement for a few days.  Please Note:  You might notice some irritation and congestion in your nose or some drainage.  This is from the oxygen used during your procedure.  There is no need for concern and it should clear up in a day or so.  SYMPTOMS TO REPORT IMMEDIATELY:   Following lower endoscopy (colonoscopy or flexible sigmoidoscopy):  Excessive amounts of blood in the stool  Significant tenderness or worsening of abdominal pains  Swelling of the abdomen that is new, acute  Fever of 100F or higher   For urgent or emergent issues, a gastroenterologist can be reached at  any hour by calling [phone]. Do not use MyChart messaging for urgent concerns.    DIET:  We do recommend a small meal at first, but then you may proceed to your regular diet.  Drink plenty of fluids but you should avoid alcoholic beverages for 24 hours.  ACTIVITY:  You should plan to take it easy for the rest of today and you should NOT DRIVE or use heavy machinery until tomorrow (because of the sedation medicines used during the test).    FOLLOW UP: Our staff will call the number listed on your records 48-72 hours following your procedure to check on you and address any questions or concerns that you may have regarding the information given to you following your procedure. If we do not reach you, we will leave a message.  We will attempt to reach you two times.  During this call, we will ask if you have developed any symptoms of COVID 19. If you develop any symptoms (ie: fever, flu-like symptoms, shortness of breath, cough etc.) before then, please call [phone].  If you test positive for Covid 19 in the 2 weeks post procedure, please call and report this information to [place].    If any biopsies were taken you will be contacted by phone or by letter within the next 1-3 weeks.  Please call us at [phone] if you have not heard about the biopsies in 3 weeks.    SIGNATURES/CONFIDENTIALITY: You  and/or your care partner have signed paperwork which will be entered into your electronic medical record.  These signatures attest to the fact that that the information above on your After Visit Summary has been reviewed and is understood.  Full responsibility of the confidentiality of this discharge information lies with you and/or your care-partner.

## 2020-03-16 NOTE — Op Note (Signed)
[facility] Patient Name: Randy Ford Procedure Date: 03/16/2020 11:12 AM MRN: [id number] Endoscopist: Justice Britain , MD Age: 79 Referring MD:  Date of Birth: April 21, 1941 Gender: Male Account #: [id number] Procedure:                Colonoscopy Indications:              Screening for colorectal malignant neoplasm Medicines:                Monitored Anesthesia Care Procedure:                Pre-Anesthesia Assessment:                           - Prior to the procedure, a History and Physical                            was performed, and patient medications and                            allergies were reviewed. The patient's tolerance of                            previous anesthesia was also reviewed. The risks                            and benefits of the procedure and the sedation                            options and risks were discussed with the patient.                            All questions were answered, and informed consent                            was obtained. Prior Anticoagulants: The patient has                            taken no previous anticoagulant or antiplatelet                            agents except for NSAID medication. ASA Grade                            Assessment: II - A patient with mild systemic                            disease. After reviewing the risks and benefits,                            the patient was deemed in satisfactory condition to                            undergo the procedure.  After obtaining informed consent, the colonoscope                            was passed under direct vision. Throughout the                            procedure, the patient's blood pressure, pulse, and                            oxygen saturations were monitored continuously. The                            Colonoscope was introduced through the anus and                            advanced to the the cecum, identified  by                            appendiceal orifice and ileocecal valve. The                            colonoscopy was performed without difficulty. The                            patient tolerated the procedure. The quality of the                            bowel preparation was adequate. The ileocecal                            valve, appendiceal orifice, and rectum were                            photographed. Scope In: 11:22:32 AM Scope Out: 11:40:19 AM Scope Withdrawal Time: 0 hours 12 minutes 7 seconds  Total Procedure Duration: 0 hours 17 minutes 47 seconds  Findings:                 Skin tags were found on perianal exam.                           The digital rectal exam findings include                            hemorrhoids. Pertinent negatives include no                            palpable rectal lesions.                           A large amount of stool was found in the entire                            colon, interfering with visualization. Lavage of  the area was performed using copious amounts,                            resulting in clearance with adequate visualization.                           The colon (entire examined portion) was                            significantly redundant.                           Two sessile polyps were found in the descending                            colon and ascending colon. The polyps were 2 to 3                            mm in size. These polyps were removed with a cold                            snare. Resection and retrieval were complete.                           Normal mucosa was found in the entire colon                            otherwise.                           Non-bleeding non-thrombosed external and internal                            hemorrhoids were found during retroflexion, during                            perianal exam and during digital exam. The                            hemorrhoids  were Grade II (internal hemorrhoids                            that prolapse but reduce spontaneously). Complications:            No immediate complications. Estimated Blood Loss:     Estimated blood loss was minimal. Impression:               - Perianal skin tags found on perianal exam.                           - Hemorrhoids found on digital rectal exam.                           - Stool in the entire examined colon.                           -  Redundant colon.                           - Two 2 to 3 mm polyps in the descending colon and                            in the ascending colon, removed with a cold snare.                            Resected and retrieved.                           - Normal mucosa in the entire examined colon.                           - Non-bleeding non-thrombosed external and internal                            hemorrhoids. Recommendation:           - The patient will be observed post-procedure,                            until all discharge criteria are met.                           - Discharge patient to home.                           - Patient has a contact number available for                            emergencies. The signs and symptoms of potential                            delayed complications were discussed with the                            patient. Return to normal activities tomorrow.                            Written discharge instructions were provided to the                            patient.                           - High fiber diet.                           - Use FiberCon 1-2 tablets PO daily.                           - Continue present medications.                           - Await pathology  results.                           - Repeat colonoscopy for surveillance based on                            pathology results would likely be 7 or 10 years but                            based on patient's age and medical comorbidities,                             he may no longer require colon cancer screening in                            the future - can determine based on his medical                            history in the future.                           - The findings and recommendations were discussed                            with the patient. Justice Britain, MD 03/16/2020 11:45:53 AM

## 2020-03-20 ENCOUNTER — Telehealth: Payer: Self-pay

## 2020-03-20 NOTE — Telephone Encounter (Signed)
Covid-19 screening questions   Do you now or have you had a fever in the last 14 days? No.  Do you have any respiratory symptoms of shortness of breath or cough now or in the last 14 days? No.  Do you have any family members or close contacts with diagnosed or suspected Covid-19 in the past 14 days? No.  Have you been tested for Covid-19 and found to be positive? No.       Follow up Call-  Call back number 03/16/2020  Post procedure Call Back phone  # [phone]  Permission to leave phone message Yes  Some recent data might be hidden     Patient questions:  Do you have a fever, pain , or abdominal swelling? No. Pain Score  0 *  Have you tolerated food without any problems? Yes.    Have you been able to return to your normal activities? Yes.    Do you have any questions about your discharge instructions: Diet   No. Medications  No. Follow up visit  Yes.  Pt. Wondered when would he get his results, and reported it took a few days for his GI tract to return to full function.  Pt. Back to his normal diet and activities.   Do you have questions or concerns about your Care? No.  Actions: * If pain score is 4 or above: No action needed, pain <4.

## 2020-03-21 ENCOUNTER — Encounter: Payer: Self-pay | Admitting: Gastroenterology

## 2020-06-08 DIAGNOSIS — K5900 Constipation, unspecified: Secondary | ICD-10-CM | POA: Diagnosis not present

## 2020-06-08 DIAGNOSIS — G4700 Insomnia, unspecified: Secondary | ICD-10-CM | POA: Diagnosis not present

## 2020-06-08 DIAGNOSIS — G4733 Obstructive sleep apnea (adult) (pediatric): Secondary | ICD-10-CM | POA: Diagnosis not present

## 2020-06-08 DIAGNOSIS — E039 Hypothyroidism, unspecified: Secondary | ICD-10-CM | POA: Diagnosis not present

## 2020-06-08 DIAGNOSIS — N1831 Chronic kidney disease, stage 3a: Secondary | ICD-10-CM | POA: Diagnosis not present

## 2020-06-08 DIAGNOSIS — I10 Essential (primary) hypertension: Secondary | ICD-10-CM | POA: Diagnosis not present

## 2020-06-08 DIAGNOSIS — N320 Bladder-neck obstruction: Secondary | ICD-10-CM | POA: Diagnosis not present

## 2020-06-08 DIAGNOSIS — M545 Low back pain, unspecified: Secondary | ICD-10-CM | POA: Diagnosis not present

## 2020-06-22 DIAGNOSIS — R310 Gross hematuria: Secondary | ICD-10-CM | POA: Diagnosis not present

## 2020-06-22 DIAGNOSIS — R3914 Feeling of incomplete bladder emptying: Secondary | ICD-10-CM | POA: Diagnosis not present

## 2020-07-04 DIAGNOSIS — H524 Presbyopia: Secondary | ICD-10-CM | POA: Diagnosis not present

## 2020-07-04 DIAGNOSIS — H52203 Unspecified astigmatism, bilateral: Secondary | ICD-10-CM | POA: Diagnosis not present

## 2020-07-04 DIAGNOSIS — Z961 Presence of intraocular lens: Secondary | ICD-10-CM | POA: Diagnosis not present

## 2020-07-04 DIAGNOSIS — H10413 Chronic giant papillary conjunctivitis, bilateral: Secondary | ICD-10-CM | POA: Diagnosis not present

## 2020-07-17 DIAGNOSIS — M545 Low back pain, unspecified: Secondary | ICD-10-CM | POA: Diagnosis not present

## 2020-07-31 DIAGNOSIS — M545 Low back pain, unspecified: Secondary | ICD-10-CM | POA: Diagnosis not present

## 2020-08-03 ENCOUNTER — Encounter (INDEPENDENT_AMBULATORY_CARE_PROVIDER_SITE_OTHER): Payer: Self-pay | Admitting: Ophthalmology

## 2020-08-03 ENCOUNTER — Other Ambulatory Visit: Payer: Self-pay

## 2020-08-03 ENCOUNTER — Ambulatory Visit (INDEPENDENT_AMBULATORY_CARE_PROVIDER_SITE_OTHER): Payer: Medicare Other | Admitting: Ophthalmology

## 2020-08-03 DIAGNOSIS — H35411 Lattice degeneration of retina, right eye: Secondary | ICD-10-CM

## 2020-08-03 DIAGNOSIS — H3562 Retinal hemorrhage, left eye: Secondary | ICD-10-CM | POA: Diagnosis not present

## 2020-08-03 DIAGNOSIS — H35361 Drusen (degenerative) of macula, right eye: Secondary | ICD-10-CM

## 2020-08-03 DIAGNOSIS — Z8669 Personal history of other diseases of the nervous system and sense organs: Secondary | ICD-10-CM

## 2020-08-03 DIAGNOSIS — H35371 Puckering of macula, right eye: Secondary | ICD-10-CM | POA: Diagnosis not present

## 2020-08-03 DIAGNOSIS — Z9889 Other specified postprocedural states: Secondary | ICD-10-CM

## 2020-08-03 NOTE — Progress Notes (Signed)
08/03/2020     CHIEF COMPLAINT Patient presents for Retina Follow Up (2 Year f\u OU. OCT/Pt states vision is stable. Pt c/o tearing OU but is now using rewetting gtts. Pt sees Randy Ford. Pt has occasional floater that stays in vision for about 10 min then goes away.)   HISTORY OF PRESENT ILLNESS: Randy Ford is a 80 y.o. male who presents to the clinic today for:   HPI    Retina Follow Up    Diagnosis: ERM.  In right eye.  Severity is moderate.  Duration of 2 years.  I, the attending physician,  performed the HPI with the patient and updated documentation appropriately. Additional comments: 2 Year f\u OU. OCT Pt states vision is stable. Pt c/o tearing OU but is now using rewetting gtts. Pt sees Randy Ford. Pt has occasional floater that stays in vision for about 10 min then goes away.       Last edited by Tilda Franco on 08/03/2020  9:04 AM. (History)      Referring physician: Seward Carol, MD [address],  [place] [address]  HISTORICAL INFORMATION:   Selected notes from the [facility]: No current outpatient medications on file. (Ophthalmic Drugs)   No current facility-administered medications for this visit. (Ophthalmic Drugs)   Current Outpatient Medications (Other)  Medication Sig  . amLODipine (NORVASC) 5 MG tablet TAKE ONE TABLET EACH DAY  . Cyanocobalamin (VITAMIN B 12 PO) Take 1,000 mcg by mouth daily.  . diazepam (VALIUM) 10 MG tablet 10 mg daily as needed. (Patient not taking: Reported on 03/16/2020)  . levothyroxine (SYNTHROID, LEVOTHROID) 75 MCG tablet Take 75 mcg by mouth daily.    . Melatonin 5 MG TABS Take 1 tablet by mouth at bedtime as needed.  . meloxicam (MOBIC) 15 MG tablet Take 15 mg by mouth at bedtime.  . mirtazapine (REMERON) 15 MG tablet Take 15 mg by mouth at bedtime as needed.   . polyethylene glycol (MIRALAX / GLYCOLAX) 17 g packet Take 17 g by mouth daily.   . Potassium 99 MG TABS  Take 1 tablet by mouth daily.  . simvastatin (ZOCOR) 20 MG tablet TAKE ONE TABLET EACH DAY  . Vitamin D, Cholecalciferol, 1000 UNITS TABS Take 1 tablet by mouth daily.   No current facility-administered medications for this visit. (Other)      REVIEW OF SYSTEMS:    ALLERGIES No Known Allergies  PAST MEDICAL HISTORY Past Medical History:  Diagnosis Date  . Anemia    hx of   . Anxiety   . Arthritis    osteoarthritis rt foot; gout  . Cataract    bilateral eye sx  . Chest pain    denies presently; myoview,holter monitor wnl; Dr Randy Ford cardiologist  . Dysuria-frequency syndrome   . Eczema   . Foley catheter in place    uses self cath nightly as recommended by urologist at [facility]  . High cholesterol   . History of urinary tract infection   . Hydronephrosis, bilateral   . Hyperlipidemia    on meds  . Hypertension    on meds  . Hypothyroidism    on meds  . IBS (irritable bowel syndrome)   . Nocturia   . OSA (obstructive sleep apnea) 08/08/2008   AHI 1.09/hr  . Sleep apnea    uses CPAP  . Tendonitis, Achilles, right   . Wears glasses  Past Surgical History:  Procedure Laterality Date  . benign lipomas on arms  Bilateral    removed 2016  . CATARACT EXTRACTION     x2  . CYSTOSCOPY W/ URETERAL STENT PLACEMENT Bilateral 10/06/2015   Procedure: CYSTOSCOPY WITH BILATERAL RETROGRADE PYELOGRAM BILATERAL URETERAL STENT PLACEMENT WITH LEFT URETEROSCOPY;  Surgeon: Randy Hughs, MD;  Location: [facility];  Service: Urology;  Laterality: Bilateral;  . EYE SURGERY Bilateral    detached retina; 3 on left eye; once on right  . MASS EXCISION Bilateral    fatty tumors rmoved from arms  . NASAL SEPTOPLASTY W/ TURBINOPLASTY Bilateral 05/15/2016   Procedure: NASAL SEPTOPLASTY WITH BILATERAL INFERIOR TURBINATE REDUCTION;  Surgeon: Randy Belfast, MD;  Location: [facility];  Service: ENT;  Laterality: Bilateral;  . PROSTATECTOMY N/A 12/15/2015   Procedure:  OPEN SIMPLE PROSTATECTOMY;  Surgeon: Randy Hughs, MD;  Location: [facility];  Service: Urology;  Laterality: N/A;  . TONSILLECTOMY  1948  . WISDOM TOOTH EXTRACTION      FAMILY HISTORY Family History  Problem Relation Age of Onset  . Dementia Mother   . Bladder Cancer Mother 71  . Cancer Father 38  . Lung cancer Father 68  . Prostate cancer Brother 33  . Colon polyps Neg Hx   . Colon cancer Neg Hx   . Esophageal cancer Neg Hx   . Stomach cancer Neg Hx   . Rectal cancer Neg Hx     SOCIAL HISTORY Social History   Tobacco Use  . Smoking status: Never Smoker  . Smokeless tobacco: Never Used  Vaping Use  . Vaping Use: Never used  Substance Use Topics  . Alcohol use: Not Currently    Comment: 1/2 beer once a month  . Drug use: No         OPHTHALMIC EXAM:  Base Eye Exam    Visual Acuity (Snellen - Linear)      Right Left   Dist cc 20/25 -2 20/30       Tonometry (Tonopen, 9:07 AM)      Right Left   Pressure 17 15       Pupils      Pupils Dark Light Shape React APD   Right PERRL 4 3 Round Slow None   Left PERRL 4 3 Round Slow None       Visual Fields (Counting fingers)      Left Right    Full Full       Neuro/Psych    Oriented x3: Yes   Mood/Affect: Normal       Dilation    Both eyes: 1.0% Mydriacyl, 2.5% Phenylephrine @ 9:07 AM        Slit Lamp and Fundus Exam    External Exam      Right Left   External Normal Normal       Slit Lamp Exam      Right Left   Lids/Lashes Normal Normal   Conjunctiva/Sclera White and quiet White and quiet   Cornea Clear Clear   Anterior Chamber Deep and quiet Deep and quiet   Iris Round and reactive Round and reactive   Lens Centered posterior chamber intraocular lens Centered posterior chamber intraocular lens   Anterior Vitreous Normal Normal       Fundus Exam      Right Left   Posterior Vitreous Clear, avitric Normal   Disc Myopic crescent Normal   C/D Ratio 0.15 0.2   Macula No topographic  distortion to the fovea Normal   Vessels Normal Normal   Periphery Good retinopexy peripherally retina attached,  Normal          IMAGING AND PROCEDURES  Imaging and Procedures for 08/03/20  OCT, Retina - OU - Both Eyes       Right Eye Quality was good. Scan locations included subfoveal. Central Foveal Thickness: 301. Progression has been stable. Findings include epiretinal membrane.   Left Eye Quality was good. Scan locations included subfoveal. Central Foveal Thickness: 305. Progression has been stable. Findings include central retinal atrophy, inner retinal atrophy.   Notes Nasal to the fovea, no interval change over the last 2 years.  OD  OS, with diffuse thinning of the retina, as a consequence of prior damage from severe epiretinal membrane approached and corrected and surgically in 2013.  Stable since that time                  ASSESSMENT/PLAN:  Macular pucker, right eye Minor epiretinal membrane nasal to the visual axis and nasal to the fovea OD, stable over years, will continue to observe  Right retinal lattice degeneration Repaired, in the past as retinal detachment no change      ICD-10-CM   1. Macular pucker, right eye  H35.371 OCT, Retina - OU - Both Eyes  2. Right retinal lattice degeneration  H35.411   3. Retinal hemorrhage of left eye  H35.62   4. Degenerative retinal drusen of right eye  H35.361   5. History of retinal detachment  Z86.69   6. History of vitrectomy  Z98.890     1.  No active disease in either eye today  2.  Patient encouraged to contact the office promptly for new onset visual acuity declines or distortions or changes.  3.  We did spend some time as the patient was concerned about constipation leading and triggering to eye problems.  I  Discussed with patient briefly about the use of Hyde colony count probiotics as a potential attempt to help out with appears to be in sounds like to is IBS-C considerations  Ophthalmic Meds  Ordered this visit:  No orders of the defined types were placed in this encounter.      Return in about 2 years (around 08/04/2022) for DILATE OU, COLOR FP, OCT.  There are no Patient Instructions on file for this visit.   Explained the diagnoses, plan, and follow up with the patient and they expressed understanding.  Patient expressed understanding of the importance of proper follow up care.   Clent Demark Correy Weidner M.D. Diseases & Surgery of the Retina and Vitreous [facility] 08/03/20     Abbreviations: M myopia (nearsighted); A astigmatism; H hyperopia (farsighted); P presbyopia; Mrx spectacle prescription;  CTL contact lenses; OD right eye; OS left eye; OU both eyes  XT exotropia; ET esotropia; PEK punctate epithelial keratitis; PEE punctate epithelial erosions; DES dry eye syndrome; MGD meibomian gland dysfunction; ATs artificial tears; PFAT's preservative free artificial tears; [place] nuclear sclerotic cataract; PSC posterior subcapsular cataract; ERM epi-retinal membrane; PVD posterior vitreous detachment; RD retinal detachment; DM diabetes mellitus; DR diabetic retinopathy; NPDR non-proliferative diabetic retinopathy; PDR proliferative diabetic retinopathy; CSME clinically significant macular edema; DME diabetic macular edema; dbh dot blot hemorrhages; CWS cotton wool spot; POAG primary open angle glaucoma; C/D cup-to-disc ratio; HVF humphrey visual field; GVF goldmann visual field; OCT optical coherence tomography; IOP intraocular pressure; BRVO Branch retinal vein occlusion; CRVO central retinal vein occlusion; CRAO central  retinal artery occlusion; BRAO branch retinal artery occlusion; RT retinal tear; SB scleral buckle; PPV pars plana vitrectomy; VH Vitreous hemorrhage; PRP panretinal laser photocoagulation; IVK intravitreal kenalog; VMT vitreomacular traction; MH Macular hole;  NVD neovascularization of the disc; NVE neovascularization elsewhere; AREDS age related eye disease  study; ARMD age related macular degeneration; POAG primary open angle glaucoma; EBMD epithelial/anterior basement membrane dystrophy; ACIOL anterior chamber intraocular lens; IOL intraocular lens; PCIOL posterior chamber intraocular lens; Phaco/IOL phacoemulsification with intraocular lens placement; [place] photorefractive keratectomy; LASIK laser assisted in situ keratomileusis; HTN hypertension; DM diabetes mellitus; COPD chronic obstructive pulmonary disease

## 2020-08-03 NOTE — Assessment & Plan Note (Signed)
Repaired, in the past as retinal detachment no change

## 2020-08-03 NOTE — Assessment & Plan Note (Signed)
Minor epiretinal membrane nasal to the visual axis and nasal to the fovea OD, stable over years, will continue to observe

## 2020-08-09 DIAGNOSIS — R339 Retention of urine, unspecified: Secondary | ICD-10-CM | POA: Diagnosis not present

## 2020-08-09 DIAGNOSIS — M545 Low back pain, unspecified: Secondary | ICD-10-CM | POA: Diagnosis not present

## 2020-08-14 DIAGNOSIS — M545 Low back pain, unspecified: Secondary | ICD-10-CM | POA: Diagnosis not present

## 2020-08-21 DIAGNOSIS — M545 Low back pain, unspecified: Secondary | ICD-10-CM | POA: Diagnosis not present

## 2020-08-28 DIAGNOSIS — M545 Low back pain, unspecified: Secondary | ICD-10-CM | POA: Diagnosis not present

## 2020-09-05 DIAGNOSIS — M545 Low back pain, unspecified: Secondary | ICD-10-CM | POA: Diagnosis not present

## 2020-09-05 DIAGNOSIS — R339 Retention of urine, unspecified: Secondary | ICD-10-CM | POA: Diagnosis not present

## 2020-09-13 DIAGNOSIS — M545 Low back pain, unspecified: Secondary | ICD-10-CM | POA: Diagnosis not present

## 2020-09-25 DIAGNOSIS — M545 Low back pain, unspecified: Secondary | ICD-10-CM | POA: Diagnosis not present

## 2020-10-02 DIAGNOSIS — R339 Retention of urine, unspecified: Secondary | ICD-10-CM | POA: Diagnosis not present

## 2020-10-09 DIAGNOSIS — M545 Low back pain, unspecified: Secondary | ICD-10-CM | POA: Diagnosis not present

## 2020-11-01 DIAGNOSIS — R339 Retention of urine, unspecified: Secondary | ICD-10-CM | POA: Diagnosis not present

## 2020-12-12 DIAGNOSIS — Z1389 Encounter for screening for other disorder: Secondary | ICD-10-CM | POA: Diagnosis not present

## 2020-12-12 DIAGNOSIS — I10 Essential (primary) hypertension: Secondary | ICD-10-CM | POA: Diagnosis not present

## 2020-12-12 DIAGNOSIS — N3281 Overactive bladder: Secondary | ICD-10-CM | POA: Diagnosis not present

## 2020-12-12 DIAGNOSIS — N320 Bladder-neck obstruction: Secondary | ICD-10-CM | POA: Diagnosis not present

## 2020-12-12 DIAGNOSIS — N1831 Chronic kidney disease, stage 3a: Secondary | ICD-10-CM | POA: Diagnosis not present

## 2020-12-12 DIAGNOSIS — Z0000 Encounter for general adult medical examination without abnormal findings: Secondary | ICD-10-CM | POA: Diagnosis not present

## 2020-12-12 DIAGNOSIS — G4733 Obstructive sleep apnea (adult) (pediatric): Secondary | ICD-10-CM | POA: Diagnosis not present

## 2020-12-12 DIAGNOSIS — R339 Retention of urine, unspecified: Secondary | ICD-10-CM | POA: Diagnosis not present

## 2020-12-12 DIAGNOSIS — E039 Hypothyroidism, unspecified: Secondary | ICD-10-CM | POA: Diagnosis not present

## 2020-12-27 DIAGNOSIS — D2271 Melanocytic nevi of right lower limb, including hip: Secondary | ICD-10-CM | POA: Diagnosis not present

## 2020-12-27 DIAGNOSIS — B078 Other viral warts: Secondary | ICD-10-CM | POA: Diagnosis not present

## 2020-12-27 DIAGNOSIS — D2272 Melanocytic nevi of left lower limb, including hip: Secondary | ICD-10-CM | POA: Diagnosis not present

## 2020-12-27 DIAGNOSIS — L820 Inflamed seborrheic keratosis: Secondary | ICD-10-CM | POA: Diagnosis not present

## 2020-12-27 DIAGNOSIS — L821 Other seborrheic keratosis: Secondary | ICD-10-CM | POA: Diagnosis not present

## 2020-12-30 DIAGNOSIS — M545 Low back pain, unspecified: Secondary | ICD-10-CM | POA: Diagnosis not present

## 2021-01-02 DIAGNOSIS — Z961 Presence of intraocular lens: Secondary | ICD-10-CM | POA: Diagnosis not present

## 2021-01-02 DIAGNOSIS — H10413 Chronic giant papillary conjunctivitis, bilateral: Secondary | ICD-10-CM | POA: Diagnosis not present

## 2021-01-23 DIAGNOSIS — R339 Retention of urine, unspecified: Secondary | ICD-10-CM | POA: Diagnosis not present

## 2021-02-02 DIAGNOSIS — G4700 Insomnia, unspecified: Secondary | ICD-10-CM | POA: Diagnosis not present

## 2021-02-02 DIAGNOSIS — R1031 Right lower quadrant pain: Secondary | ICD-10-CM | POA: Diagnosis not present

## 2021-02-13 DIAGNOSIS — R339 Retention of urine, unspecified: Secondary | ICD-10-CM | POA: Diagnosis not present

## 2021-02-22 DIAGNOSIS — M25511 Pain in right shoulder: Secondary | ICD-10-CM | POA: Diagnosis not present

## 2021-02-22 DIAGNOSIS — M542 Cervicalgia: Secondary | ICD-10-CM | POA: Diagnosis not present

## 2021-03-06 ENCOUNTER — Other Ambulatory Visit: Payer: Self-pay

## 2021-03-06 ENCOUNTER — Encounter: Payer: Self-pay | Admitting: Cardiovascular Disease

## 2021-03-06 ENCOUNTER — Ambulatory Visit: Payer: Medicare Other | Admitting: Cardiovascular Disease

## 2021-03-06 DIAGNOSIS — G4733 Obstructive sleep apnea (adult) (pediatric): Secondary | ICD-10-CM | POA: Diagnosis not present

## 2021-03-06 DIAGNOSIS — R339 Retention of urine, unspecified: Secondary | ICD-10-CM | POA: Diagnosis not present

## 2021-03-06 DIAGNOSIS — E782 Mixed hyperlipidemia: Secondary | ICD-10-CM

## 2021-03-06 DIAGNOSIS — I10 Essential (primary) hypertension: Secondary | ICD-10-CM | POA: Diagnosis not present

## 2021-03-06 NOTE — Assessment & Plan Note (Signed)
History of essential hypertension a blood pressure measured today at 136/66.  He is on amlodipine.

## 2021-03-06 NOTE — Patient Instructions (Signed)

## 2021-03-06 NOTE — Assessment & Plan Note (Signed)
History of hyperlipidemia on simvastatin with lipid profile performed 12/12/2020 revealing total cholesterol of 220, LDL 121 and HDL 41.  He does apparently eat a heart healthy diet.  We will recheck a lipid profile in a year which time we will address his LDL.

## 2021-03-06 NOTE — Progress Notes (Signed)
03/06/2021 Rudie Ford   06-23-1940  [id number]  Primary Physician Seward Carol, MD Primary Cardiologist: Lorretta Harp MD Lupe Carney, [place]  HPI:  Randy Ford is a 80 y.o.  mildly overweight married Caucasian male with no children. I last saw him in the office    03/07/2020. He has a history of hypertension, hyperlipidemia and obstructive sleep apnea on C Pap. He had a Myoview stress test performed on 06/24/2008 which was negative. His lipid profile followed by his primary care physician Dr. Delfina Redwood during he took an excessive amount of oral iron pills one month ago in attempt to increase his hemoglobin prior to voluntary blood donation after which he developed substernal chest pain which has been constant. His primary care physician thought this could either be "ischemia, reflux, or musculoskeletal. I performed a Myoview stress test on him 08/16/14 which was entirely remote normal as was a 2 week event monitor performed because of palpitations.    Since I saw him a year ago he continues to do well.  He denies chest pain or shortness of breath.  Does complain of some arthritis and some bladder issues.     Current Meds  Medication Sig   amLODipine (NORVASC) 5 MG tablet TAKE ONE TABLET EACH DAY   Cyanocobalamin (VITAMIN B 12 PO) Take 1,000 mcg by mouth daily.   diazepam (VALIUM) 10 MG tablet 10 mg daily as needed.   levothyroxine (SYNTHROID, LEVOTHROID) 75 MCG tablet Take 75 mcg by mouth daily.     Melatonin 5 MG TABS Take 1 tablet by mouth at bedtime as needed.   meloxicam (MOBIC) 15 MG tablet Take 15 mg by mouth at bedtime.   mirtazapine (REMERON) 15 MG tablet Take 15 mg by mouth at bedtime as needed.    polyethylene glycol (MIRALAX / GLYCOLAX) 17 g packet Take 17 g by mouth daily.    Potassium 99 MG TABS Take 1 tablet by mouth daily.   simvastatin (ZOCOR) 20 MG tablet TAKE ONE TABLET EACH DAY   Vitamin D, Cholecalciferol, 1000 UNITS TABS Take 1 tablet by mouth daily.      No Known Allergies  Social History   Socioeconomic History   Marital status: Married    Spouse name: Not on file   Number of children: Not on file   Years of education: Not on file   Highest education level: Not on file  Occupational History   Occupation: Retired    Comment: Pharmacist, hospital  Tobacco Use   Smoking status: Never   Smokeless tobacco: Never  Vaping Use   Vaping Use: Never used  Substance and Sexual Activity   Alcohol use: Not Currently    Comment: 1/2 beer once a month   Drug use: No   Sexual activity: Not on file  Other Topics Concern   Not on file  Social History Narrative   Not on file   Social Determinants of Health   Financial Resource Strain: Not on file  Food Insecurity: Not on file  Transportation Needs: Not on file  Physical Activity: Not on file  Stress: Not on file  Social Connections: Not on file  Intimate Partner Violence: Not on file     Review of Systems: General: negative for chills, fever, night sweats or weight changes.  Cardiovascular: negative for chest pain, dyspnea on exertion, edema, orthopnea, palpitations, paroxysmal nocturnal dyspnea or shortness of breath Dermatological: negative for rash Respiratory: negative for cough or wheezing Urologic: negative for hematuria Abdominal:  negative for nausea, vomiting, diarrhea, bright red blood per rectum, melena, or hematemesis Neurologic: negative for visual changes, syncope, or dizziness All other systems reviewed and are otherwise negative except as noted above.    Blood pressure 136/66, pulse 82, height 5\' 11"  (1.803 m), weight 205 lb (93 kg).  General appearance: alert and no distress Neck: no adenopathy, no carotid bruit, no JVD, supple, symmetrical, trachea midline, and thyroid not enlarged, symmetric, no tenderness/mass/nodules Lungs: clear to auscultation bilaterally Heart: regular rate and rhythm, S1, S2 normal, no murmur, click, rub or gallop Extremities: extremities normal,  atraumatic, no cyanosis or edema Pulses: 2+ and symmetric Skin: Skin color, texture, turgor normal. No rashes or lesions Neurologic: Grossly normal  EKG sinus rhythm 82 with borderline LVH voltage and nonspecific ST and T wave changes.  I personally reviewed this EKG.  ASSESSMENT AND PLAN:   Essential hypertension History of essential hypertension a blood pressure measured today at 136/66.  He is on amlodipine.  Hyperlipidemia History of hyperlipidemia on simvastatin with lipid profile performed 12/12/2020 revealing total cholesterol of 220, LDL 121 and HDL 41.  He does apparently eat a heart healthy diet.  We will recheck a lipid profile in a year which time we will address his LDL.  Obstructive sleep apnea History of obstructive sleep apnea on CPAP.     Lorretta Harp MD FACP,FACC,FAHA, [facility] 03/06/2021 1:51 PM

## 2021-03-06 NOTE — Assessment & Plan Note (Signed)
History of obstructive sleep apnea on CPAP. 

## 2021-03-08 DIAGNOSIS — H10413 Chronic giant papillary conjunctivitis, bilateral: Secondary | ICD-10-CM | POA: Diagnosis not present

## 2021-04-03 DIAGNOSIS — R339 Retention of urine, unspecified: Secondary | ICD-10-CM | POA: Diagnosis not present

## 2021-05-03 DIAGNOSIS — R339 Retention of urine, unspecified: Secondary | ICD-10-CM | POA: Diagnosis not present

## 2021-06-12 DIAGNOSIS — N3281 Overactive bladder: Secondary | ICD-10-CM | POA: Diagnosis not present

## 2021-06-12 DIAGNOSIS — G4733 Obstructive sleep apnea (adult) (pediatric): Secondary | ICD-10-CM | POA: Diagnosis not present

## 2021-06-12 DIAGNOSIS — N1831 Chronic kidney disease, stage 3a: Secondary | ICD-10-CM | POA: Diagnosis not present

## 2021-06-12 DIAGNOSIS — E7800 Pure hypercholesterolemia, unspecified: Secondary | ICD-10-CM | POA: Diagnosis not present

## 2021-06-12 DIAGNOSIS — I10 Essential (primary) hypertension: Secondary | ICD-10-CM | POA: Diagnosis not present

## 2021-07-05 DIAGNOSIS — Z961 Presence of intraocular lens: Secondary | ICD-10-CM | POA: Diagnosis not present

## 2021-07-05 DIAGNOSIS — H26491 Other secondary cataract, right eye: Secondary | ICD-10-CM | POA: Diagnosis not present

## 2021-07-05 DIAGNOSIS — H524 Presbyopia: Secondary | ICD-10-CM | POA: Diagnosis not present

## 2021-07-17 DIAGNOSIS — M542 Cervicalgia: Secondary | ICD-10-CM | POA: Diagnosis not present

## 2021-07-17 DIAGNOSIS — M545 Low back pain, unspecified: Secondary | ICD-10-CM | POA: Diagnosis not present

## 2021-07-18 DIAGNOSIS — R339 Retention of urine, unspecified: Secondary | ICD-10-CM | POA: Diagnosis not present

## 2021-08-23 DIAGNOSIS — M545 Low back pain, unspecified: Secondary | ICD-10-CM | POA: Diagnosis not present

## 2021-08-23 DIAGNOSIS — M542 Cervicalgia: Secondary | ICD-10-CM | POA: Diagnosis not present

## 2021-09-13 DIAGNOSIS — M545 Low back pain, unspecified: Secondary | ICD-10-CM | POA: Diagnosis not present

## 2021-09-13 DIAGNOSIS — M542 Cervicalgia: Secondary | ICD-10-CM | POA: Diagnosis not present

## 2021-09-14 DIAGNOSIS — H26491 Other secondary cataract, right eye: Secondary | ICD-10-CM | POA: Diagnosis not present

## 2021-09-14 DIAGNOSIS — Z961 Presence of intraocular lens: Secondary | ICD-10-CM | POA: Diagnosis not present

## 2021-10-11 DIAGNOSIS — H26491 Other secondary cataract, right eye: Secondary | ICD-10-CM | POA: Diagnosis not present

## 2021-11-19 DIAGNOSIS — E039 Hypothyroidism, unspecified: Secondary | ICD-10-CM | POA: Diagnosis not present

## 2021-11-19 DIAGNOSIS — N1831 Chronic kidney disease, stage 3a: Secondary | ICD-10-CM | POA: Diagnosis not present

## 2021-11-19 DIAGNOSIS — E782 Mixed hyperlipidemia: Secondary | ICD-10-CM | POA: Diagnosis not present

## 2021-11-19 DIAGNOSIS — I10 Essential (primary) hypertension: Secondary | ICD-10-CM | POA: Diagnosis not present

## 2021-11-19 DIAGNOSIS — K219 Gastro-esophageal reflux disease without esophagitis: Secondary | ICD-10-CM | POA: Diagnosis not present

## 2021-12-17 DIAGNOSIS — G4733 Obstructive sleep apnea (adult) (pediatric): Secondary | ICD-10-CM | POA: Diagnosis not present

## 2021-12-17 DIAGNOSIS — E7800 Pure hypercholesterolemia, unspecified: Secondary | ICD-10-CM | POA: Diagnosis not present

## 2021-12-17 DIAGNOSIS — I10 Essential (primary) hypertension: Secondary | ICD-10-CM | POA: Diagnosis not present

## 2021-12-17 DIAGNOSIS — Z0000 Encounter for general adult medical examination without abnormal findings: Secondary | ICD-10-CM | POA: Diagnosis not present

## 2021-12-17 DIAGNOSIS — N1831 Chronic kidney disease, stage 3a: Secondary | ICD-10-CM | POA: Diagnosis not present

## 2021-12-17 DIAGNOSIS — E039 Hypothyroidism, unspecified: Secondary | ICD-10-CM | POA: Diagnosis not present

## 2021-12-17 DIAGNOSIS — N3281 Overactive bladder: Secondary | ICD-10-CM | POA: Diagnosis not present

## 2021-12-27 DIAGNOSIS — L738 Other specified follicular disorders: Secondary | ICD-10-CM | POA: Diagnosis not present

## 2021-12-27 DIAGNOSIS — D2271 Melanocytic nevi of right lower limb, including hip: Secondary | ICD-10-CM | POA: Diagnosis not present

## 2021-12-27 DIAGNOSIS — D2261 Melanocytic nevi of right upper limb, including shoulder: Secondary | ICD-10-CM | POA: Diagnosis not present

## 2021-12-27 DIAGNOSIS — C4442 Squamous cell carcinoma of skin of scalp and neck: Secondary | ICD-10-CM | POA: Diagnosis not present

## 2021-12-27 DIAGNOSIS — D2272 Melanocytic nevi of left lower limb, including hip: Secondary | ICD-10-CM | POA: Diagnosis not present

## 2021-12-27 DIAGNOSIS — L821 Other seborrheic keratosis: Secondary | ICD-10-CM | POA: Diagnosis not present

## 2022-01-11 DIAGNOSIS — H5201 Hypermetropia, right eye: Secondary | ICD-10-CM | POA: Diagnosis not present

## 2022-01-11 DIAGNOSIS — H52203 Unspecified astigmatism, bilateral: Secondary | ICD-10-CM | POA: Diagnosis not present

## 2022-01-11 DIAGNOSIS — H532 Diplopia: Secondary | ICD-10-CM | POA: Diagnosis not present

## 2022-01-11 DIAGNOSIS — H31003 Unspecified chorioretinal scars, bilateral: Secondary | ICD-10-CM | POA: Diagnosis not present

## 2022-01-25 ENCOUNTER — Other Ambulatory Visit: Payer: Self-pay | Admitting: Internal Medicine

## 2022-01-25 DIAGNOSIS — K5900 Constipation, unspecified: Secondary | ICD-10-CM | POA: Diagnosis not present

## 2022-01-25 DIAGNOSIS — R4189 Other symptoms and signs involving cognitive functions and awareness: Secondary | ICD-10-CM

## 2022-01-25 DIAGNOSIS — R4182 Altered mental status, unspecified: Secondary | ICD-10-CM

## 2022-02-13 ENCOUNTER — Ambulatory Visit
Admission: RE | Admit: 2022-02-13 | Discharge: 2022-02-13 | Disposition: A | Discharge status: Home / Self Care | Payer: Medicare Other | Source: Ambulatory Visit | Attending: Internal Medicine | Admitting: Internal Medicine

## 2022-02-13 DIAGNOSIS — R4189 Other symptoms and signs involving cognitive functions and awareness: Secondary | ICD-10-CM

## 2022-02-13 DIAGNOSIS — R4182 Altered mental status, unspecified: Secondary | ICD-10-CM

## 2022-02-14 ENCOUNTER — Ambulatory Visit
Admission: RE | Admit: 2022-02-14 | Discharge: 2022-02-14 | Disposition: A | Discharge status: Home / Self Care | Payer: Medicare Other | Source: Ambulatory Visit | Attending: Internal Medicine | Admitting: Internal Medicine

## 2022-02-14 ENCOUNTER — Other Ambulatory Visit: Payer: Self-pay | Admitting: Internal Medicine

## 2022-02-14 DIAGNOSIS — R9089 Other abnormal findings on diagnostic imaging of central nervous system: Secondary | ICD-10-CM

## 2022-02-14 DIAGNOSIS — G939 Disorder of brain, unspecified: Secondary | ICD-10-CM | POA: Diagnosis not present

## 2022-02-14 DIAGNOSIS — R930 Abnormal findings on diagnostic imaging of skull and head, not elsewhere classified: Secondary | ICD-10-CM | POA: Diagnosis not present

## 2022-02-21 DIAGNOSIS — R930 Abnormal findings on diagnostic imaging of skull and head, not elsewhere classified: Secondary | ICD-10-CM | POA: Diagnosis not present

## 2022-02-22 ENCOUNTER — Other Ambulatory Visit: Payer: Self-pay | Admitting: Internal Medicine

## 2022-02-22 DIAGNOSIS — R930 Abnormal findings on diagnostic imaging of skull and head, not elsewhere classified: Secondary | ICD-10-CM

## 2022-03-06 ENCOUNTER — Ambulatory Visit
Admission: RE | Admit: 2022-03-06 | Discharge: 2022-03-06 | Disposition: A | Discharge status: Home / Self Care | Payer: Medicare Other | Source: Ambulatory Visit | Attending: Internal Medicine | Admitting: Internal Medicine

## 2022-03-06 DIAGNOSIS — M4802 Spinal stenosis, cervical region: Secondary | ICD-10-CM | POA: Diagnosis not present

## 2022-03-06 DIAGNOSIS — R930 Abnormal findings on diagnostic imaging of skull and head, not elsewhere classified: Secondary | ICD-10-CM

## 2022-03-07 ENCOUNTER — Inpatient Hospital Stay: Admission: RE | Admit: 2022-03-07 | Payer: Medicare Other | Source: Ambulatory Visit

## 2022-04-02 ENCOUNTER — Encounter: Payer: Self-pay | Admitting: Cardiovascular Disease

## 2022-04-02 ENCOUNTER — Ambulatory Visit: Payer: Medicare Other | Attending: Cardiovascular Disease | Admitting: Cardiovascular Disease

## 2022-04-02 DIAGNOSIS — E782 Mixed hyperlipidemia: Secondary | ICD-10-CM | POA: Diagnosis not present

## 2022-04-02 DIAGNOSIS — I10 Essential (primary) hypertension: Secondary | ICD-10-CM | POA: Diagnosis not present

## 2022-04-02 NOTE — Progress Notes (Signed)
04/02/2022 [name]   1940/10/05  [id number]  Primary Physician Seward Carol, MD Primary Cardiologist: Lorretta Harp MD Lupe Carney, [place]  HPI:  Randy Ford is a 81 y.o.  mildly overweight married Caucasian male with no children. I last saw him in the office  03/06/21.  He is accompanied by his wife Jeani Hawking today.  He has a history of hypertension, hyperlipidemia and obstructive sleep apnea on C Pap. He had a Myoview stress test performed on 06/24/2008 which was negative. His lipid profile followed by his primary care physician Dr. Delfina Redwood during he took an excessive amount of oral iron pills one month ago in attempt to increase his hemoglobin prior to voluntary blood donation after which he developed substernal chest pain which has been constant. His primary care physician thought this could either be "ischemia, reflux, or musculoskeletal. I performed a Myoview stress test on him 08/16/14 which was entirely remote normal as was a 2 week event monitor performed because of palpitations.    Since I saw him a year ago he continues to do well.  He denies chest pain or shortness of breath.  Does complain of some arthritis and some bladder issues.  He has lost 40 pounds as result of diet and exercise and feels clinically improved.  He no longer wears his CPAP for sleep apnea as a result.   Current Meds  Medication Sig   amLODipine (NORVASC) 5 MG tablet TAKE ONE TABLET EACH DAY   levothyroxine (SYNTHROID, LEVOTHROID) 75 MCG tablet Take 75 mcg by mouth daily.     mirtazapine (REMERON) 15 MG tablet Take 15 mg by mouth at bedtime as needed.    Omega-3 Fatty Acids (FISH OIL OMEGA-3 PO) Take 1 Capful by mouth in the morning and at bedtime.   polyethylene glycol (MIRALAX / GLYCOLAX) 17 g packet Take 17 g by mouth daily.    Potassium 99 MG TABS Take 1 tablet by mouth daily.   simvastatin (ZOCOR) 20 MG tablet TAKE ONE TABLET EACH DAY   Vitamin D, Cholecalciferol, 1000 UNITS TABS Take 1 tablet  by mouth daily.   [DISCONTINUED] Cyanocobalamin (VITAMIN B 12 PO) Take 1,000 mcg by mouth daily.   [DISCONTINUED] diazepam (VALIUM) 10 MG tablet 10 mg daily as needed.   [DISCONTINUED] Melatonin 5 MG TABS Take 1 tablet by mouth at bedtime as needed.   [DISCONTINUED] meloxicam (MOBIC) 15 MG tablet Take 15 mg by mouth at bedtime.     No Known Allergies  Social History   Socioeconomic History   Marital status: Married    Spouse name: Not on file   Number of children: Not on file   Years of education: Not on file   Highest education level: Not on file  Occupational History   Occupation: Retired    Comment: Pharmacist, hospital  Tobacco Use   Smoking status: Never   Smokeless tobacco: Never  Vaping Use   Vaping Use: Never used  Substance and Sexual Activity   Alcohol use: Not Currently    Comment: 1/2 beer once a month   Drug use: No   Sexual activity: Not on file  Other Topics Concern   Not on file  Social History Narrative   Not on file   Social Determinants of Health   Financial Resource Strain: Not on file  Food Insecurity: Not on file  Transportation Needs: Not on file  Physical Activity: Not on file  Stress: Not on file  Social Connections: Not on  file  Intimate Partner Violence: Not on file     Review of Systems: General: negative for chills, fever, night sweats or weight changes.  Cardiovascular: negative for chest pain, dyspnea on exertion, edema, orthopnea, palpitations, paroxysmal nocturnal dyspnea or shortness of breath Dermatological: negative for rash Respiratory: negative for cough or wheezing Urologic: negative for hematuria Abdominal: negative for nausea, vomiting, diarrhea, bright red blood per rectum, melena, or hematemesis Neurologic: negative for visual changes, syncope, or dizziness All other systems reviewed and are otherwise negative except as noted above.    Blood pressure 130/62, pulse 69, height '5\' 11"'$  (1.803 m), weight 182 lb (82.6 kg).  General  appearance: alert and no distress Neck: no adenopathy, no carotid bruit, no JVD, supple, symmetrical, trachea midline, and thyroid not enlarged, symmetric, no tenderness/mass/nodules Lungs: clear to auscultation bilaterally Heart: regular rate and rhythm, S1, S2 normal, no murmur, click, rub or gallop Extremities: extremities normal, atraumatic, no cyanosis or edema Pulses: 2+ and symmetric Skin: Skin color, texture, turgor normal. No rashes or lesions Neurologic: Grossly normal  EKG sinus rhythm at 69 without ST or T wave changes.  Personally reviewed this EKG.  ASSESSMENT AND PLAN:   Essential hypertension History of essential hypertension a blood pressure measured today at 130/62.  He is on amlodipine.  Hyperlipidemia History of hyperlipidemia on statin therapy with lipid profile performed 12/17/2021 revealing total cholesterol 177, LDL of 92 and HDL of 43, acceptable for primary prevention.  Obstructive sleep apnea History of obstructive sleep apnea no longer requiring CPAP after losing 40 pounds.     Lorretta Harp MD FACP,FACC,FAHA, [facility] 04/02/2022 10:27 AM

## 2022-04-02 NOTE — Patient Instructions (Signed)
Medication Instructions:  Your physician recommends that you continue on your current medications as directed. Please refer to the Current Medication list given to you today.  *If you need a refill on your cardiac medications before your next appointment, please call your pharmacy*   Follow-Up: At [facility], you and your health needs are our priority.  As part of our continuing mission to provide you with exceptional heart care, we have created designated Provider Care Teams.  These Care Teams include your primary Cardiologist (physician) and Advanced Practice Providers (APPs -  Physician Assistants and Nurse Practitioners) who all work together to provide you with the care you need, when you need it.  We recommend signing up for the patient portal called "MyChart".  Sign up information is provided on this After Visit Summary.  MyChart is used to connect with patients for Virtual Visits (Telemedicine).  Patients are able to view lab/test results, encounter notes, upcoming appointments, etc.  Non-urgent messages can be sent to your provider as well.   To learn more about what you can do with MyChart, go to [email or web address].    Your next appointment:   12 month(s)  The format for your next appointment:   In Person  Provider:   Jonathan Berry, MD   

## 2022-04-02 NOTE — Assessment & Plan Note (Signed)
History of obstructive sleep apnea no longer requiring CPAP after losing 40 pounds.

## 2022-04-02 NOTE — Assessment & Plan Note (Signed)
History of essential hypertension a blood pressure measured today at 130/62.  He is on amlodipine.

## 2022-04-02 NOTE — Assessment & Plan Note (Signed)
History of hyperlipidemia on statin therapy with lipid profile performed 12/17/2021 revealing total cholesterol 177, LDL of 92 and HDL of 43, acceptable for primary prevention.

## 2022-04-04 ENCOUNTER — Ambulatory Visit
Admission: RE | Admit: 2022-04-04 | Discharge: 2022-04-04 | Disposition: A | Discharge status: Home / Self Care | Payer: Medicare Other | Source: Ambulatory Visit | Attending: Internal Medicine | Admitting: Internal Medicine

## 2022-04-04 ENCOUNTER — Other Ambulatory Visit: Payer: Self-pay | Admitting: Internal Medicine

## 2022-04-04 DIAGNOSIS — N320 Bladder-neck obstruction: Secondary | ICD-10-CM | POA: Diagnosis not present

## 2022-04-04 DIAGNOSIS — R1030 Lower abdominal pain, unspecified: Secondary | ICD-10-CM | POA: Diagnosis not present

## 2022-04-04 DIAGNOSIS — R109 Unspecified abdominal pain: Secondary | ICD-10-CM

## 2022-04-11 DIAGNOSIS — K5900 Constipation, unspecified: Secondary | ICD-10-CM | POA: Diagnosis not present

## 2022-05-28 DIAGNOSIS — N451 Epididymitis: Secondary | ICD-10-CM | POA: Diagnosis not present

## 2022-05-28 DIAGNOSIS — R3914 Feeling of incomplete bladder emptying: Secondary | ICD-10-CM | POA: Diagnosis not present

## 2022-06-19 DIAGNOSIS — N1831 Chronic kidney disease, stage 3a: Secondary | ICD-10-CM | POA: Diagnosis not present

## 2022-06-19 DIAGNOSIS — E039 Hypothyroidism, unspecified: Secondary | ICD-10-CM | POA: Diagnosis not present

## 2022-06-19 DIAGNOSIS — I10 Essential (primary) hypertension: Secondary | ICD-10-CM | POA: Diagnosis not present

## 2022-06-19 DIAGNOSIS — K5900 Constipation, unspecified: Secondary | ICD-10-CM | POA: Diagnosis not present

## 2022-06-19 DIAGNOSIS — E7800 Pure hypercholesterolemia, unspecified: Secondary | ICD-10-CM | POA: Diagnosis not present

## 2022-08-08 ENCOUNTER — Encounter (INDEPENDENT_AMBULATORY_CARE_PROVIDER_SITE_OTHER): Payer: Medicare Other | Admitting: Ophthalmology

## 2022-09-20 DIAGNOSIS — H31093 Other chorioretinal scars, bilateral: Secondary | ICD-10-CM | POA: Diagnosis not present

## 2022-09-20 DIAGNOSIS — H04123 Dry eye syndrome of bilateral lacrimal glands: Secondary | ICD-10-CM | POA: Diagnosis not present

## 2022-09-20 DIAGNOSIS — H35371 Puckering of macula, right eye: Secondary | ICD-10-CM | POA: Diagnosis not present

## 2022-12-30 DIAGNOSIS — L2089 Other atopic dermatitis: Secondary | ICD-10-CM | POA: Diagnosis not present

## 2022-12-30 DIAGNOSIS — Z85828 Personal history of other malignant neoplasm of skin: Secondary | ICD-10-CM | POA: Diagnosis not present

## 2022-12-30 DIAGNOSIS — L821 Other seborrheic keratosis: Secondary | ICD-10-CM | POA: Diagnosis not present

## 2022-12-30 DIAGNOSIS — L738 Other specified follicular disorders: Secondary | ICD-10-CM | POA: Diagnosis not present

## 2022-12-30 DIAGNOSIS — D225 Melanocytic nevi of trunk: Secondary | ICD-10-CM | POA: Diagnosis not present

## 2022-12-30 DIAGNOSIS — D1801 Hemangioma of skin and subcutaneous tissue: Secondary | ICD-10-CM | POA: Diagnosis not present

## 2022-12-30 DIAGNOSIS — D2271 Melanocytic nevi of right lower limb, including hip: Secondary | ICD-10-CM | POA: Diagnosis not present

## 2022-12-30 DIAGNOSIS — D2272 Melanocytic nevi of left lower limb, including hip: Secondary | ICD-10-CM | POA: Diagnosis not present

## 2022-12-30 DIAGNOSIS — D2261 Melanocytic nevi of right upper limb, including shoulder: Secondary | ICD-10-CM | POA: Diagnosis not present

## 2022-12-30 DIAGNOSIS — D2262 Melanocytic nevi of left upper limb, including shoulder: Secondary | ICD-10-CM | POA: Diagnosis not present

## 2022-12-30 DIAGNOSIS — B353 Tinea pedis: Secondary | ICD-10-CM | POA: Diagnosis not present

## 2023-01-06 DIAGNOSIS — N1831 Chronic kidney disease, stage 3a: Secondary | ICD-10-CM | POA: Diagnosis not present

## 2023-01-06 DIAGNOSIS — Z0000 Encounter for general adult medical examination without abnormal findings: Secondary | ICD-10-CM | POA: Diagnosis not present

## 2023-01-06 DIAGNOSIS — E7800 Pure hypercholesterolemia, unspecified: Secondary | ICD-10-CM | POA: Diagnosis not present

## 2023-01-06 DIAGNOSIS — Z23 Encounter for immunization: Secondary | ICD-10-CM | POA: Diagnosis not present

## 2023-01-06 DIAGNOSIS — N3281 Overactive bladder: Secondary | ICD-10-CM | POA: Diagnosis not present

## 2023-01-06 DIAGNOSIS — N320 Bladder-neck obstruction: Secondary | ICD-10-CM | POA: Diagnosis not present

## 2023-01-06 DIAGNOSIS — Z5181 Encounter for therapeutic drug level monitoring: Secondary | ICD-10-CM | POA: Diagnosis not present

## 2023-01-06 DIAGNOSIS — G4733 Obstructive sleep apnea (adult) (pediatric): Secondary | ICD-10-CM | POA: Diagnosis not present

## 2023-01-06 DIAGNOSIS — E039 Hypothyroidism, unspecified: Secondary | ICD-10-CM | POA: Diagnosis not present

## 2023-01-13 DIAGNOSIS — H02832 Dermatochalasis of right lower eyelid: Secondary | ICD-10-CM | POA: Diagnosis not present

## 2023-01-13 DIAGNOSIS — H02835 Dermatochalasis of left lower eyelid: Secondary | ICD-10-CM | POA: Diagnosis not present

## 2023-01-13 DIAGNOSIS — H52203 Unspecified astigmatism, bilateral: Secondary | ICD-10-CM | POA: Diagnosis not present

## 2023-01-13 DIAGNOSIS — H31003 Unspecified chorioretinal scars, bilateral: Secondary | ICD-10-CM | POA: Diagnosis not present

## 2023-01-13 DIAGNOSIS — H02831 Dermatochalasis of right upper eyelid: Secondary | ICD-10-CM | POA: Diagnosis not present

## 2023-01-13 DIAGNOSIS — H02834 Dermatochalasis of left upper eyelid: Secondary | ICD-10-CM | POA: Diagnosis not present

## 2023-04-14 ENCOUNTER — Ambulatory Visit: Payer: Medicare Other | Attending: Cardiovascular Disease | Admitting: Cardiovascular Disease

## 2023-04-14 ENCOUNTER — Encounter: Payer: Self-pay | Admitting: Cardiovascular Disease

## 2023-04-14 VITALS — BP 130/56 | HR 65 | Ht 71.0 in | Wt 190.0 lb

## 2023-04-14 DIAGNOSIS — I10 Essential (primary) hypertension: Secondary | ICD-10-CM | POA: Diagnosis not present

## 2023-04-14 DIAGNOSIS — E782 Mixed hyperlipidemia: Secondary | ICD-10-CM | POA: Diagnosis not present

## 2023-04-14 DIAGNOSIS — G4733 Obstructive sleep apnea (adult) (pediatric): Secondary | ICD-10-CM | POA: Diagnosis not present

## 2023-04-14 NOTE — Assessment & Plan Note (Signed)
History of obstructive sleep apnea no longer requiring CPAP since his significant weight loss.

## 2023-04-14 NOTE — Assessment & Plan Note (Signed)
History of essential hypertension blood pressure measured today 130/56.  He is on amlodipine.

## 2023-04-14 NOTE — Patient Instructions (Signed)
    Follow-Up: At [facility], you and your health needs are our priority.  As part of our continuing mission to provide you with exceptional heart care, we have created designated Provider Care Teams.  These Care Teams include your primary Cardiologist (physician) and Advanced Practice Providers (APPs -  Physician Assistants and Nurse Practitioners) who all work together to provide you with the care you need, when you need it.  We recommend signing up for the patient portal called "MyChart".  Sign up information is provided on this After Visit Summary.  MyChart is used to connect with patients for Virtual Visits (Telemedicine).  Patients are able to view lab/test results, encounter notes, upcoming appointments, etc.  Non-urgent messages can be sent to your provider as well.   To learn more about what you can do with MyChart, go to [email or web address].    Your next appointment:   12 month(s)  Provider:   Jonathan Berry, MD      

## 2023-04-14 NOTE — Progress Notes (Signed)
04/14/2023 Randy Ford   1941/04/20  [id number]  Primary Physician Randy Dills, MD Primary Cardiologist: Runell Gess MD Nicholes Calamity, [place]  HPI:  Randy Ford is a 82 y.o.  mildly overweight married Caucasian male with no children. I last saw him in the office 04/02/2022.  He is accompanied by his wife Randy Ford today.  He has a history of hypertension, hyperlipidemia and obstructive sleep apnea on C Pap. He had a Myoview stress test performed on 06/24/2008 which was negative. His lipid profile followed by his primary care physician Dr. Nehemiah Settle during he took an excessive amount of oral iron pills one month ago in attempt to increase his hemoglobin prior to voluntary blood donation after which he developed substernal chest pain which has been constant. His primary care physician thought this could either be "ischemia, reflux, or musculoskeletal. I performed a Myoview stress test on him 08/16/14 which was entirely remote normal as was a 2 week event monitor performed because of palpitations.    Since I saw him a year ago he continues to do well.  He denies chest pain or shortness of breath.  Does complain of some arthritis and some bladder issues.  He has lost 40 pounds as result of diet and exercise and feels clinically improved.  He no longer wears his CPAP for sleep apnea as a result.  He walks 4 days a week for 30 minutes at a brisk pace without symptoms.   Current Meds  Medication Sig   amLODipine (NORVASC) 5 MG tablet TAKE ONE TABLET EACH DAY   levothyroxine (SYNTHROID, LEVOTHROID) 75 MCG tablet Take 75 mcg by mouth daily.     mirtazapine (REMERON) 15 MG tablet Take 15 mg by mouth at bedtime as needed.    Omega-3 Fatty Acids (FISH OIL OMEGA-3 PO) Take 1 Capful by mouth in the morning and at bedtime.   polyethylene glycol (MIRALAX / GLYCOLAX) 17 g packet Take 17 g by mouth daily.    Potassium 99 MG TABS Take 1 tablet by mouth daily.   simvastatin (ZOCOR) 20 MG tablet TAKE ONE  TABLET EACH DAY   Vitamin D, Cholecalciferol, 1000 UNITS TABS Take 1 tablet by mouth daily.     No Known Allergies  Social History   Socioeconomic History   Marital status: Married    Spouse name: Not on file   Number of children: Not on file   Years of education: Not on file   Highest education level: Not on file  Occupational History   Occupation: Retired    Comment: Runner, broadcasting/film/video  Tobacco Use   Smoking status: Never   Smokeless tobacco: Never  Vaping Use   Vaping status: Never Used  Substance and Sexual Activity   Alcohol use: Not Currently    Comment: 1/2 beer once a month   Drug use: No   Sexual activity: Not on file  Other Topics Concern   Not on file  Social History Narrative   Not on file   Social Determinants of Health   Financial Resource Strain: Not on file  Food Insecurity: Not on file  Transportation Needs: Not on file  Physical Activity: Not on file  Stress: Not on file  Social Connections: Not on file  Intimate Partner Violence: Not on file     Review of Systems: General: negative for chills, fever, night sweats or weight changes.  Cardiovascular: negative for chest pain, dyspnea on exertion, edema, orthopnea, palpitations, paroxysmal nocturnal dyspnea or shortness of  breath Dermatological: negative for rash Respiratory: negative for cough or wheezing Urologic: negative for hematuria Abdominal: negative for nausea, vomiting, diarrhea, bright red blood per rectum, melena, or hematemesis Neurologic: negative for visual changes, syncope, or dizziness All other systems reviewed and are otherwise negative except as noted above.    Blood pressure (!) 130/56, pulse 65, height 5\' 11"  (1.803 m), weight 190 lb (86.2 kg), SpO2 95%.  General appearance: alert and no distress Neck: no adenopathy, no carotid bruit, no JVD, supple, symmetrical, trachea midline, and thyroid not enlarged, symmetric, no tenderness/mass/nodules Lungs: clear to auscultation  bilaterally Heart: regular rate and rhythm, S1, S2 normal, no murmur, click, rub or gallop Extremities: extremities normal, atraumatic, no cyanosis or edema Pulses: 2+ and symmetric Skin: Skin color, texture, turgor normal. No rashes or lesions Neurologic: Grossly normal  EKG EKG Interpretation Date/Time:  Monday April 14 2023 14:42:14 EST Ventricular Rate:  65 PR Interval:  206 QRS Duration:  112 QT Interval:  382 QTC Calculation: 397 R Axis:   5  Text Interpretation: Normal sinus rhythm Nonspecific T wave abnormality When compared with ECG of 09-May-2008 12:56, Sinus rhythm has replaced Junctional rhythm Confirmed by Nanetta Batty [phone]) on 04/14/2023 2:45:26 PM    ASSESSMENT AND PLAN:   Essential hypertension History of essential hypertension blood pressure measured today 130/56.  He is on amlodipine.  Hyperlipidemia History of hyperlipidemia on simvastatin with lipid profile performed 8/5 for revealing total Cholesterol 188, LDL 116 HDL 52.  This represents a slight increase in his LDL compared to last year when it was measured at 92.  Will continue to follow this.  He may need adjustments in his lipid-lowering medications.  Obstructive sleep apnea History of obstructive sleep apnea no longer requiring CPAP since his significant weight loss.     Runell Gess MD FACP,FACC,FAHA, [facility] 04/14/2023 2:57 PM

## 2023-04-14 NOTE — Assessment & Plan Note (Signed)
History of hyperlipidemia on simvastatin with lipid profile performed 8/5 for revealing total Cholesterol 188, LDL 116 HDL 52.  This represents a slight increase in his LDL compared to last year when it was measured at 92.  Will continue to follow this.  He may need adjustments in his lipid-lowering medications.

## 2023-07-07 DIAGNOSIS — E039 Hypothyroidism, unspecified: Secondary | ICD-10-CM | POA: Diagnosis not present

## 2023-07-07 DIAGNOSIS — N320 Bladder-neck obstruction: Secondary | ICD-10-CM | POA: Diagnosis not present

## 2023-07-07 DIAGNOSIS — I10 Essential (primary) hypertension: Secondary | ICD-10-CM | POA: Diagnosis not present

## 2023-07-07 DIAGNOSIS — N1831 Chronic kidney disease, stage 3a: Secondary | ICD-10-CM | POA: Diagnosis not present

## 2023-07-07 DIAGNOSIS — K5900 Constipation, unspecified: Secondary | ICD-10-CM | POA: Diagnosis not present

## 2023-07-28 DIAGNOSIS — R109 Unspecified abdominal pain: Secondary | ICD-10-CM | POA: Diagnosis not present

## 2023-07-28 DIAGNOSIS — K5900 Constipation, unspecified: Secondary | ICD-10-CM | POA: Diagnosis not present

## 2023-07-29 DIAGNOSIS — K5900 Constipation, unspecified: Secondary | ICD-10-CM | POA: Diagnosis not present

## 2023-08-07 DIAGNOSIS — R194 Change in bowel habit: Secondary | ICD-10-CM | POA: Diagnosis not present

## 2023-08-07 DIAGNOSIS — R109 Unspecified abdominal pain: Secondary | ICD-10-CM | POA: Diagnosis not present

## 2023-08-08 ENCOUNTER — Other Ambulatory Visit: Payer: Self-pay | Admitting: Internal Medicine

## 2023-08-08 DIAGNOSIS — R194 Change in bowel habit: Secondary | ICD-10-CM

## 2023-09-01 ENCOUNTER — Ambulatory Visit
Admission: RE | Admit: 2023-09-01 | Discharge: 2023-09-01 | Disposition: A | Discharge status: Home / Self Care | Source: Ambulatory Visit | Attending: Internal Medicine | Admitting: Internal Medicine

## 2023-09-01 DIAGNOSIS — K5900 Constipation, unspecified: Secondary | ICD-10-CM | POA: Diagnosis not present

## 2023-09-01 DIAGNOSIS — R194 Change in bowel habit: Secondary | ICD-10-CM

## 2023-09-01 DIAGNOSIS — D3502 Benign neoplasm of left adrenal gland: Secondary | ICD-10-CM | POA: Diagnosis not present

## 2023-09-01 DIAGNOSIS — K869 Disease of pancreas, unspecified: Secondary | ICD-10-CM | POA: Diagnosis not present

## 2023-09-01 DIAGNOSIS — I700 Atherosclerosis of aorta: Secondary | ICD-10-CM | POA: Diagnosis not present

## 2023-09-08 DIAGNOSIS — R935 Abnormal findings on diagnostic imaging of other abdominal regions, including retroperitoneum: Secondary | ICD-10-CM | POA: Diagnosis not present

## 2023-09-08 DIAGNOSIS — K5900 Constipation, unspecified: Secondary | ICD-10-CM | POA: Diagnosis not present

## 2023-09-09 ENCOUNTER — Other Ambulatory Visit: Payer: Self-pay | Admitting: Internal Medicine

## 2023-09-09 DIAGNOSIS — R935 Abnormal findings on diagnostic imaging of other abdominal regions, including retroperitoneum: Secondary | ICD-10-CM

## 2023-09-10 ENCOUNTER — Encounter: Payer: Self-pay | Admitting: Gastroenterology

## 2023-09-21 ENCOUNTER — Ambulatory Visit
Admission: RE | Admit: 2023-09-21 | Discharge: 2023-09-21 | Disposition: A | Discharge status: Home / Self Care | Source: Ambulatory Visit | Attending: Internal Medicine | Admitting: Internal Medicine

## 2023-09-21 DIAGNOSIS — K862 Cyst of pancreas: Secondary | ICD-10-CM | POA: Diagnosis not present

## 2023-09-21 DIAGNOSIS — R935 Abnormal findings on diagnostic imaging of other abdominal regions, including retroperitoneum: Secondary | ICD-10-CM

## 2023-09-21 MED ORDER — GADOPICLENOL 0.5 MMOL/ML IV SOLN
8.0000 mL | Freq: Once | INTRAVENOUS | Status: AC | PRN
  Administered 2023-09-21: 8 mL via INTRAVENOUS

## 2023-10-17 ENCOUNTER — Other Ambulatory Visit

## 2023-10-20 ENCOUNTER — Encounter: Payer: Self-pay | Admitting: Gastroenterology

## 2023-10-20 ENCOUNTER — Ambulatory Visit: Admitting: Gastroenterology

## 2023-10-20 VITALS — BP 130/62 | HR 79 | Ht 71.0 in | Wt 181.0 lb

## 2023-10-20 DIAGNOSIS — K5909 Other constipation: Secondary | ICD-10-CM

## 2023-10-20 DIAGNOSIS — K581 Irritable bowel syndrome with constipation: Secondary | ICD-10-CM

## 2023-10-20 DIAGNOSIS — K869 Disease of pancreas, unspecified: Secondary | ICD-10-CM

## 2023-10-20 DIAGNOSIS — Z860100 Personal history of colon polyps, unspecified: Secondary | ICD-10-CM | POA: Diagnosis not present

## 2023-10-20 MED ORDER — LINACLOTIDE 290 MCG PO CAPS: 290.0000 ug | ORAL_CAPSULE | Freq: Every day | ORAL | 4 refills | Status: DC

## 2023-10-20 NOTE — Patient Instructions (Addendum)
 We are providing you with samples today of Linzess  and we have also sent this in to the drug store for you. Linzess  works best when taken once a day every day, on an empty stomach, at least 30 minutes before your first meal of the day.  When Linzess  is taken daily as directed:  *Constipation relief is typically felt in about a week *IBS-C patients may begin to experience relief from belly pain and overall abdominal symptoms (pain, discomfort, and bloating) in about 1 week,   with symptoms typically improving over 12 weeks.  Diarrhea may occur in the first 2 weeks -keep taking it.  The diarrhea should go away and you should start having normal, complete, full bowel movements. It may be helpful to start treatment when you can be near the comfort of your own bathroom, such as a weekend.    We have placed a referral for pelvic floor physical therapy. They will contact you about setting up an appointment.   I appreciate the opportunity to care for you. Suzanna Erp, PA

## 2023-10-20 NOTE — Progress Notes (Signed)
 Chief Complaint: Constipation and pancreatic lesion Primary GI MD: Dr. Brice Campi  HPI: 83 year old male with PMH as listed below presents for evaluation of constipation and pancreatic lesion on imaging.  CTAP WO contrast for constipation 09/2023 showed 1.5 x 1.4 cm decreased attenuation at the junction of the body and tail of pancreas could correlate with pancreatic cystic lesion on CTAP 09/2023 with follow-up MRI abdomen showing fluid signal and cystic lesions in the ventral pancreatic tail measuring 1.4 x 1.3 cm and in the ventral pancreatic neck measuring 1.1 x 1.0 cm.  No pancreatic ductal dilation or inflammatory changes.  Suspect sidebranch IPMN's.  Discussed the use of AI scribe software for clinical note transcription with the patient, who gave verbal consent to proceed.  History of Present Illness Randy Ford is an 83 year old male who presents with chronic constipation and incomplete bowel evacuation. He is accompanied by his wife. He was referred by Dr. Merl Star for evaluation of his chronic constipation.  He has experienced constipation for several years, initially using a half dose of Miralax, which was later increased to a full dose without significant improvement. He then began using his wife's stool softener continuously. Despite these measures, constipation persisted, leading him to seek help from his primary care physician, who prescribed Linzess  and advised increased water intake.  He is currently taking one tablet of Linzess , as the prescription for two tablets was not refillable. He also continues to take a full dose of Miralax every morning. He has regular bowel movements almost every evening, six to seven nights a week, but experiences a sensation of incomplete evacuation. After a bowel movement, he feels the remaining stool moves into position, prompting him to use an enema to clear it out, which helps him sleep.  He has undergone several diagnostic studies,  including a colonoscopy several years ago, which indicated slow stool movement through the colon. Recent imaging, including a CT scan and MRI, showed a lot of stool in the colon and a lesion in the pancreas. He also had an x-ray that showed no significant findings.  He experiences anxiety related to his bowel issues, particularly at bedtime, which affects his sleep. He takes mirtazapine to help with sleep and anxiety. He gets up four times a night to take his prescription medications.  He has tried using a Training and development officer with occasional success but generally finds it ineffective. He is not currently on any fiber supplements.   PREVIOUS GI WORKUP   Colonoscopy 03/2020 - Perianal skin tags found on perianal exam.  - Hemorrhoids found on digital rectal exam.  - Stool in the entire examined colon.  - Redundant colon.  - Two 2 to 3 mm polyps in the descending colon and in the ascending colon, removed with a cold snare. Resected and retrieved.  - Normal mucosa in the entire examined colon.  - Non- bleeding non- thrombosed external and internal hemorrhoids. - repeat 7 years  Surgical [P], colon, descending x 1, ascending x 1, polyp (2) - TUBULAR ADENOMA WITHOUT HIGH-GRADE DYSPLASIA OR MALIGNANCY - OTHER FRAGMENTS OF POLYPOID COLONIC MUCOSA WITH PROMINENT LYMPHOID AGGREGATES  Past Medical History:  Diagnosis Date   Anemia    hx of    Anxiety    Arthritis    osteoarthritis rt foot; gout   Cataract    bilateral eye sx   Chest pain    denies presently; myoview,holter monitor wnl; Dr Katheryne Pane cardiologist   Dysuria-frequency syndrome    Eczema  Foley catheter in place    uses self cath nightly as recommended by urologist at [facility]   High cholesterol    History of urinary tract infection    Hydronephrosis, bilateral    Hyperlipidemia    on meds   Hypertension    on meds   Hypothyroidism    on meds   IBS (irritable bowel syndrome)    Nocturia    OSA (obstructive sleep  apnea) 08/08/2008   AHI 1.09/hr   Sleep apnea    uses CPAP   Tendonitis, Achilles, right    Wears glasses     Past Surgical History:  Procedure Laterality Date   benign lipomas on arms  Bilateral    removed 2016   CATARACT EXTRACTION     x2   CYSTOSCOPY W/ URETERAL STENT PLACEMENT Bilateral 10/06/2015   Procedure: CYSTOSCOPY WITH BILATERAL RETROGRADE PYELOGRAM BILATERAL URETERAL STENT PLACEMENT WITH LEFT URETEROSCOPY;  Surgeon: Andrez Banker, MD;  Location: [facility];  Service: Urology;  Laterality: Bilateral;   EYE SURGERY Bilateral    detached retina; 3 on left eye; once on right   MASS EXCISION Bilateral    fatty tumors rmoved from arms   NASAL SEPTOPLASTY W/ TURBINOPLASTY Bilateral 05/15/2016   Procedure: NASAL SEPTOPLASTY WITH BILATERAL INFERIOR TURBINATE REDUCTION;  Surgeon: Ammon Bales, MD;  Location: [facility];  Service: ENT;  Laterality: Bilateral;   PROSTATECTOMY N/A 12/15/2015   Procedure: OPEN SIMPLE PROSTATECTOMY;  Surgeon: Andrez Banker, MD;  Location: [facility];  Service: Urology;  Laterality: N/A;   TONSILLECTOMY  1948   WISDOM TOOTH EXTRACTION      Current Outpatient Medications  Medication Sig Dispense Refill   amLODipine  (NORVASC ) 5 MG tablet TAKE ONE TABLET EACH DAY 90 tablet 1   levothyroxine  (SYNTHROID , LEVOTHROID) 75 MCG tablet Take 75 mcg by mouth daily.       mirtazapine (REMERON) 15 MG tablet Take 15 mg by mouth at bedtime as needed.      Omega-3 Fatty Acids (FISH OIL OMEGA-3 PO) Take 1 Capful by mouth in the morning and at bedtime.     OVER THE COUNTER MEDICATION Pt taking stool softener 1 a day     polyethylene glycol (MIRALAX / GLYCOLAX) 17 g packet Take 17 g by mouth daily.      Potassium 99 MG TABS Take 1 tablet by mouth daily.     simvastatin  (ZOCOR ) 20 MG tablet TAKE ONE TABLET EACH DAY 90 tablet 1   Vitamin D, Cholecalciferol, 1000 UNITS TABS Take 1 tablet by mouth daily.     linaclotide  (LINZESS ) 290 MCG CAPS capsule Take 1  capsule (290 mcg total) by mouth daily before breakfast. 30 capsule 4   No current facility-administered medications for this visit.    Allergies as of 10/20/2023   (No Known Allergies)    Family History  Problem Relation Age of Onset   Dementia Mother    Bladder Cancer Mother 56   Cancer Father 83   Lung cancer Father 61   Prostate cancer Brother 92   Colon polyps Neg Hx    Colon cancer Neg Hx    Esophageal cancer Neg Hx    Stomach cancer Neg Hx    Rectal cancer Neg Hx     Social History   Socioeconomic History   Marital status: Married    Spouse name: Not on file   Number of children: Not on file   Years of education: Not on file  Highest education level: Not on file  Occupational History   Occupation: Retired    Comment: Runner, broadcasting/film/video   Occupation: retired  Tobacco Use   Smoking status: Never   Smokeless tobacco: Never  Advertising account planner   Vaping status: Never Used  Substance and Sexual Activity   Alcohol use: Not Currently    Comment: 1/2 beer once a month   Drug use: No   Sexual activity: Not on file  Other Topics Concern   Not on file  Social History Narrative   Not on file   Social Drivers of Health   Financial Resource Strain: Not on file  Food Insecurity: Not on file  Transportation Needs: Not on file  Physical Activity: Not on file  Stress: Not on file  Social Connections: Not on file  Intimate Partner Violence: Not on file    Review of Systems:    Constitutional: No weight loss, fever, chills, weakness or fatigue HEENT: Eyes: No change in vision               Ears, Nose, Throat:  No change in hearing or congestion Skin: No rash or itching Cardiovascular: No chest pain, chest pressure or palpitations   Respiratory: No SOB or cough Gastrointestinal: See HPI and otherwise negative Genitourinary: No dysuria or change in urinary frequency Neurological: No headache, dizziness or syncope Musculoskeletal: No new muscle or joint pain Hematologic: No  bleeding or bruising Psychiatric: No history of depression or anxiety    Physical Exam:  Vital signs: BP 130/62   Pulse 79   Ht 5\' 11"  (1.803 m)   Wt 181 lb (82.1 kg)   BMI 25.24 kg/m   Constitutional: NAD, alert and cooperative Head:  Normocephalic and atraumatic. Eyes:   PEERL, EOMI. No icterus. Conjunctiva pink. Respiratory: Respirations even and unlabored. Lungs clear to auscultation bilaterally.   No wheezes, crackles, or rhonchi.  Cardiovascular:  Regular rate and rhythm. No peripheral edema, cyanosis or pallor.  Gastrointestinal:  Soft, nondistended, nontender. No rebound or guarding. Normal bowel sounds. No appreciable masses or hepatomegaly. Rectal:  Declines Msk:  Symmetrical without gross deformities. Without edema, no deformity or joint abnormality.  Neurologic:  Alert and  oriented x4;  grossly normal neurologically.  Skin:   Dry and intact without significant lesions or rashes. Psychiatric: Oriented to person, place and time. Demonstrates good judgement and reason without abnormal affect or behaviors.   RELEVANT LABS AND IMAGING: CBC    Component Value Date/Time   WBC 5.4 05/09/2016 1134   RBC 4.22 05/09/2016 1134   HGB 12.3 (L) 05/09/2016 1134   HCT 35.8 (L) 05/09/2016 1134   PLT 279 05/09/2016 1134   MCV 84.8 05/09/2016 1134   MCH 29.1 05/09/2016 1134   MCHC 34.4 05/09/2016 1134   RDW 14.6 05/09/2016 1134   LYMPHSABS 2.2 12/24/2010 1523   MONOABS 0.8 12/24/2010 1523   EOSABS 0.2 12/24/2010 1523   BASOSABS 0.0 12/24/2010 1523    CMP     Component Value Date/Time   NA 138 05/09/2016 1134   K 4.0 05/09/2016 1134   CL 101 05/09/2016 1134   CO2 27 05/09/2016 1134   GLUCOSE 93 05/09/2016 1134   BUN 17 05/09/2016 1134   CREATININE 1.26 (H) 05/09/2016 1134   CALCIUM  9.6 05/09/2016 1134   PROT 7.3 03/08/2019 0813   ALBUMIN 4.6 03/08/2019 0813   AST 23 03/08/2019 0813   ALT 25 03/08/2019 0813   ALKPHOS 127 (H) 03/08/2019 0813   BILITOT 0.3 03/08/2019  0813   GFRNONAA 55 (L) 05/09/2016 1134   GFRAA >60 05/09/2016 1134     Assessment/Plan:    Chronic constipation KUB 04/2022 with moderate stool burden. CTAP WO contrast 09/2023 with abundant residual fecal matter throughout colon without obstruction or infection. Colonoscopy 2021 with redundant colon, constipation, two small polyps. On Linzess  145 mcg in addition to 1 capful MiraLAX with adequate bowel movements at night followed by a feeling of incomplete evacuation also associated with anxiety using enemas regularly. Suspect IBS-C versus gut brain axis disorder.  Reassuring colonoscopy in 2021 - Increase Linzess  to 290 mcg - Continue 1 capful MiraLAX (can increase to 2 capfuls) - Follow-up for physical therapy for suspected pelvic floor dyssynergia - Follow-up 8 to 12 weeks  Pancreatic lesion 1.5 x 1.4 cm decreased attenuation at the junction of the body and tail of pancreas could correlate with pancreatic cystic lesion on CTAP 09/2023 with follow-up MRI abdomen showing fluid signal and cystic lesions in the ventral pancreatic tail measuring 1.4 x 1.3 cm and in the ventral pancreatic neck measuring 1.1 x 1.0 cm.  No pancreatic ductal dilation or inflammatory changes.  Suspect sidebranch IPMN's. - Reassurance - No follow-up recommended   Zowie Lundahl Lorina [facility] 10/20/2023, 3:33 PM  Cc: Merl Star, MD

## 2023-10-20 NOTE — Progress Notes (Signed)
 Attending Physician's Attestation   I have reviewed the chart.   I agree with the Advanced Practitioner's note, impression, and recommendations with any updates as below. Hopeful that increased Linzess  dosing will be helpful for the patient.  Agree that the cystic lesions are quite small so likely will not be an issue for the patient.  As a result of his age, it does make sense to consider not necessarily following these lesions.  If he wanted to be very aggressive, consideration of repeat MRI/MRCP in 1 year to show stability before ending surveillance could be considered as well.   Yong Henle, MD [facility] Advanced Endoscopy Office # [phone]

## 2023-10-23 ENCOUNTER — Ambulatory Visit: Attending: Gastroenterology | Admitting: Physical Therapy

## 2023-10-23 ENCOUNTER — Other Ambulatory Visit: Payer: Self-pay

## 2023-10-23 DIAGNOSIS — M6281 Muscle weakness (generalized): Secondary | ICD-10-CM | POA: Diagnosis not present

## 2023-10-23 DIAGNOSIS — R279 Unspecified lack of coordination: Secondary | ICD-10-CM | POA: Insufficient documentation

## 2023-10-23 DIAGNOSIS — R293 Abnormal posture: Secondary | ICD-10-CM | POA: Insufficient documentation

## 2023-10-23 NOTE — Patient Instructions (Addendum)
.   3-5 mins am/pm lying down, gentle circles. Shouldn't be painful. Instead of straining try balloon breathing - bring open fist to mouth and blow out.    Types of Fiber  There are two main types of fiber:  insoluble and soluble.  Both of these types can prevent and relieve constipation and diarrhea, although some people find one or the other to be more easily digested.  This handout details information about both types of fiber. recommended 25-35 grams of fiber per day,  average 9-12 grams per meal   key is a balance between soluble and insoluble  Insoluble Fiber        Functions of Insoluble Fiber moves bulk through the intestines  controls and balances the pH (acidity) in the intestines   This type of fiber should be avoided or reduced if you have soft, frequent bowel movements or leakage      Benefits of Insoluble Fiber promotes regular bowel movement and prevents constipation  removes fecal waste through colon in less time  keeps an optimal pH in intestines to prevent microbes from producing cancer substances, therefore preventing colon cancer        Food Sources of Insoluble Fiber whole-wheat products  wheat bran "miller's bran" corn bran  flax seed or other seeds vegetables such as green beans, broccoli, cauliflower and potato skins  fruit skins and root vegetable skins  popcorn brown rice  Soluble Fiber( Types 5,6,7)       Functions of Soluble Fiber  holds water in the colon to bulk and soften the stool prolongs stomach emptying time so that sugar is released and absorbed more slowly  prevent leakage associated with soft, frequent bowel movements.        Benefits of Soluble Fiber lowers total cholesterol and LDL cholesterol (the bad cholesterol) therefore reducing the risk of heart disease  regulates blood sugar for people with diabetes       Food Sources of Soluble Fiber oat/oat bran dried beans and peas  nuts  barley  flax seed or other seeds fruits such as  oranges, pears, peaches, and apples  vegetables such as carrots  psyllium husk  prunes

## 2023-10-23 NOTE — Therapy (Signed)
 OUTPATIENT PHYSICAL THERAPY MALE PELVIC EVALUATION   Patient Name: Randy Ford MRN: [id number] DOB:Dec 12, 1940, 83 y.o., male Today's Date: 10/23/2023  END OF SESSION:  PT End of Session - 10/23/23 1643     Visit Number 1    Date for PT Re-Evaluation 01/23/24    Authorization Type [employer] MCR    Progress Note Due on Visit 10    PT Start Time 1445    PT Stop Time 1530    PT Time Calculation (min) 45 min    Activity Tolerance Patient tolerated treatment well    Behavior During Therapy WFL for tasks assessed/performed             Past Medical History:  Diagnosis Date   Anemia    hx of    Anxiety    Arthritis    osteoarthritis rt foot; gout   Cataract    bilateral eye sx   Chest pain    denies presently; myoview,holter monitor wnl; Dr Katheryne Pane cardiologist   Dysuria-frequency syndrome    Eczema    Foley catheter in place    uses self cath nightly as recommended by urologist at [facility]   High cholesterol    History of urinary tract infection    Hydronephrosis, bilateral    Hyperlipidemia    on meds   Hypertension    on meds   Hypothyroidism    on meds   IBS (irritable bowel syndrome)    Nocturia    OSA (obstructive sleep apnea) 08/08/2008   AHI 1.09/hr   Sleep apnea    uses CPAP   Tendonitis, Achilles, right    Wears glasses    Past Surgical History:  Procedure Laterality Date   benign lipomas on arms  Bilateral    removed 2016   CATARACT EXTRACTION     x2   CYSTOSCOPY W/ URETERAL STENT PLACEMENT Bilateral 10/06/2015   Procedure: CYSTOSCOPY WITH BILATERAL RETROGRADE PYELOGRAM BILATERAL URETERAL STENT PLACEMENT WITH LEFT URETEROSCOPY;  Surgeon: Andrez Banker, MD;  Location: [facility];  Service: Urology;  Laterality: Bilateral;   EYE SURGERY Bilateral    detached retina; 3 on left eye; once on right   MASS EXCISION Bilateral    fatty tumors rmoved from arms   NASAL SEPTOPLASTY W/ TURBINOPLASTY Bilateral 05/15/2016   Procedure: NASAL  SEPTOPLASTY WITH BILATERAL INFERIOR TURBINATE REDUCTION;  Surgeon: Ammon Bales, MD;  Location: [facility] OR;  Service: ENT;  Laterality: Bilateral;   PROSTATECTOMY N/A 12/15/2015   Procedure: OPEN SIMPLE PROSTATECTOMY;  Surgeon: Andrez Banker, MD;  Location: [facility];  Service: Urology;  Laterality: N/A;   TONSILLECTOMY  1948   WISDOM TOOTH EXTRACTION     Patient Active Problem List   Diagnosis Date Noted   Macular pucker, right eye 08/03/2020   Right retinal lattice degeneration 08/03/2020   Retinal hemorrhage of left eye 08/03/2020   Degenerative retinal drusen of right eye 08/03/2020   History of retinal detachment 08/03/2020   History of vitrectomy 08/03/2020   BPH (benign prostatic hyperplasia) 12/15/2015   Essential hypertension 08/01/2014   Hyperlipidemia 08/01/2014   Obstructive sleep apnea 08/01/2014   Chest pain 08/01/2014   Palpitations 08/01/2014   BMI 28.0-28.9,adult 03/06/2013   Constipation 12/27/2010    PCP: Merl Star, MD   REFERRING PROVIDER: Garr Kalata, PA-C   REFERRING DIAG: K58.1 (ICD-10-CM) - Irritable bowel syndrome with constipation  THERAPY DIAG:  Muscle weakness (generalized)  Abnormal posture  Unspecified lack of coordination  Rationale for  Evaluation and Treatment: Rehabilitation  ONSET DATE: a couple months  SUBJECTIVE:                                                                                                                                                                                           SUBJECTIVE STATEMENT: I have a large bowel movement nightly usually 6 nights out of 7. But does need to take metamucil, linzess , and enema to do this. And this helps but does have a "pocket" of stool that doesn't empty without enema.   Fluid intake: 2 quarts; ice tea and milk and coffee daily usually one of each  PAIN:  Are you having pain? No   PRECAUTIONS: None  RED FLAGS: None   WEIGHT BEARING RESTRICTIONS:  No  FALLS:  Has patient fallen in last 6 months? No  LIVING ENVIRONMENT: Lives with: lives with their family Lives in: House/apartment  OCCUPATION: retired - Paramedic   PLOF: Independent  PATIENT GOALS: to have regular bowel movements  PERTINENT HISTORY:  PROSTATECTOMY, constipation  Sexual abuse:NO  BOWEL MOVEMENT: Pain with bowel movement: No Type of bowel movement:Type (Bristol Stool Scale) 5, Frequency once daily, and Strain sometimes straining  Fully empty rectum: No Leakage: No Pads: No Fiber supplement: Yes: metamucil   URINATION: Pain with urination: No Fully empty bladder: Yes:   Stream: Strong Urgency: No Frequency: not quicker than every 2 hours; 1x nightly Leakage: none Pads: No  INTERCOURSE: Pain with intercourse: none    OBJECTIVE:  Note: Objective measures were completed at Evaluation unless otherwise noted.  DIAGNOSTIC FINDINGS:    PATIENT SURVEYS:    PFIQ-7 24  COGNITION: Overall cognitive status: Within functional limits for tasks assessed     SENSATION: Light touch: Appears intact Proprioception: Appears intact  MUSCLE LENGTH: Bil hamstrings limited by 25%    GAIT: Trunk flexion and decreased step height bil  POSTURE: rounded shoulders, forward head, increased thoracic kyphosis, and posterior pelvic tilt  PELVIC ALIGNMENT: WFL  LUMBARAROM/PROM:  A/PROM A/PROM  eval  Flexion WFL  Extension WFL  Right lateral flexion Limited by 25%  Left lateral flexion Limited by 25%  Right rotation Limited by 25%  Left rotation Limited by 25%   (Blank rows = not tested)  LOWER EXTREMITY AROM/PROM:  WFL  LOWER EXTREMITY MMT:  Hips grossly 4+/5; knees 4+/5  PALPATION: GENERAL tightness in bil thoracic and lumbar spine              External Perineal Exam deferred               Internal Pelvic Floor deferred  Patient confirms identification  and approves PT to assess internal pelvic floor and treatment No  PELVIC  MMT:   MMT eval  Internal Anal Sphincter   External Anal Sphincter   Puborectalis   Diastasis Recti   (Blank rows = not tested)  TONE: Deferred   TODAY'S TREATMENT:                                                                                                                              DATE:   5/22 EVAL Examination completed, findings reviewed, pt educated on POC, HEP, and fiber types, voiding mechanics, balloon breathing, abdominal massage. Pt motivated to participate in PT and agreeable to attempt recommendations.     PATIENT EDUCATION:  Education details: [id number];  fiber types, voiding mechanics, balloon breathing, abdominal massage Person educated: Patient Education method: Explanation, Demonstration, Tactile cues, Verbal cues, and Handouts Education comprehension: verbalized understanding, returned demonstration, verbal cues required, tactile cues required, and needs further education  HOME EXERCISE PROGRAM: [id number]  ASSESSMENT:  CLINICAL IMPRESSION: Patient is a 83 y.o. male  who was seen today for physical therapy evaluation and treatment for constipation. Pt reports he does have bowel movement however needs to take several medications. Pt found to have decreased flexibility at spine and hips, decreased core and hip strength. Extra time spent on pt and spouse education on voiding and breathing mechanics, abdominal massage, types of fiber to improve peristalsis, bowel habits, and regularity without straining and for improved evacuation as pt reports he needs to normally empty 2x to fully finish. Pt would benefit from additional PT to further address deficits.     OBJECTIVE IMPAIRMENTS: decreased coordination, decreased endurance, decreased strength, impaired flexibility, and postural dysfunction.   ACTIVITY LIMITATIONS: continence  PARTICIPATION LIMITATIONS: community activity  PERSONAL FACTORS: Time since onset of injury/illness/exacerbation are also affecting  patient's functional outcome.   REHAB POTENTIAL: Good  CLINICAL DECISION MAKING: Stable/uncomplicated  EVALUATION COMPLEXITY: Low   GOALS: Goals reviewed with patient? Yes  SHORT TERM GOALS: Target date: 11/20/23  .Pt to be I with HEP for carry over and continuing recommendations for improved outcomes.   Baseline: Goal status: INITIAL  2.  Pt will be independent with use of squatty potty, relaxed toileting mechanics, and improved bowel movement techniques in order to increase ease of bowel movements and complete evacuation.   Baseline:  Goal status: INITIAL  / LONG TERM GOALS: Target date: 01/23/24 / Pt to be I with advanced HEP for carry over and continuing recommendations for improved outcomes.   Baseline:  Goal status: INITIAL  2.  Pt will report 6 BMs per week due to improved muscle tone and coordination with bowel movements with less frequent need of bowel stimulants.  Baseline:  Goal status: INITIAL  3.  Pt to report no straining for bowel movement at least 75% of the time to decreased strain at pelvic floor.  Baseline:  Goal status: INITIAL  4.pt to demonstrate no restrictions in trunk and  hip mobility to decreased restrictions at bowels and improve bowel habits.   Baseline:  Goal status: INITIAL PLAN:  PT FREQUENCY: every other week  PT DURATION: 4 sessions  PLANNED INTERVENTIONS: 97110-Therapeutic exercises, 97530- Therapeutic activity, 97112- Neuromuscular re-education, 97535- Self Care, 16109- Manual therapy, [phone]- Aquatic Therapy, Patient/Family education, Taping, Dry Needling, Joint mobilization, Spinal mobilization, Scar mobilization, DME instructions, Cryotherapy, Moist heat, and Biofeedback  PLAN FOR NEXT SESSION: abdominal massage, trunk mobility, internal if needed     Avie Lemme, PT, DPT 05/22/254:54 PM

## 2023-12-18 ENCOUNTER — Ambulatory Visit: Admitting: Physician Assistant

## 2023-12-18 ENCOUNTER — Ambulatory Visit: Payer: Self-pay | Admitting: Physician Assistant

## 2023-12-18 ENCOUNTER — Other Ambulatory Visit

## 2023-12-18 ENCOUNTER — Encounter: Payer: Self-pay | Admitting: Physician Assistant

## 2023-12-18 VITALS — BP 120/60 | HR 68 | Ht 69.0 in | Wt 182.5 lb

## 2023-12-18 DIAGNOSIS — K5900 Constipation, unspecified: Secondary | ICD-10-CM

## 2023-12-18 DIAGNOSIS — R194 Change in bowel habit: Secondary | ICD-10-CM

## 2023-12-18 LAB — COMPREHENSIVE METABOLIC PANEL WITH GFR
ALT: 21 U/L (ref 0–53)
AST: 19 U/L (ref 0–37)
Albumin: 4.7 g/dL (ref 3.5–5.2)
Alkaline Phosphatase: 100 U/L (ref 39–117)
BUN: 16 mg/dL (ref 6–23)
CO2: 27 meq/L (ref 19–32)
Calcium: 9.5 mg/dL (ref 8.4–10.5)
Chloride: 104 meq/L (ref 96–112)
Creatinine, Ser: 1.31 mg/dL (ref 0.40–1.50)
GFR: 51.09 mL/min — ABNORMAL LOW (ref >60.00)
Glucose, Bld: 93 mg/dL (ref 70–99)
Potassium: 4.2 meq/L (ref 3.5–5.1)
Sodium: 140 meq/L (ref 135–145)
Total Bilirubin: 0.5 mg/dL (ref 0.2–1.2)
Total Protein: 7.5 g/dL (ref 6.0–8.3)

## 2023-12-18 LAB — CBC WITH DIFFERENTIAL/PLATELET
Basophils Absolute: 0 K/uL (ref 0.0–0.1)
Basophils Relative: 0.7 % (ref 0.0–3.0)
Eosinophils Absolute: 0.4 K/uL (ref 0.0–0.7)
Eosinophils Relative: 6.2 % — ABNORMAL HIGH (ref 0.0–5.0)
HCT: 39.9 % (ref 39.0–52.0)
Hemoglobin: 13.4 g/dL (ref 13.0–17.0)
Lymphocytes Relative: 24.5 % (ref 12.0–46.0)
Lymphs Abs: 1.4 K/uL (ref 0.7–4.0)
MCHC: 33.7 g/dL (ref 30.0–36.0)
MCV: 91.6 fl (ref 78.0–100.0)
Monocytes Absolute: 0.8 K/uL (ref 0.1–1.0)
Monocytes Relative: 14.1 % — ABNORMAL HIGH (ref 3.0–12.0)
Neutro Abs: 3.2 K/uL (ref 1.4–7.7)
Neutrophils Relative %: 54.5 % (ref 43.0–77.0)
Platelets: 274 K/uL (ref 150.0–400.0)
RBC: 4.35 Mil/uL (ref 4.22–5.81)
RDW: 13.3 % (ref 11.5–15.5)
WBC: 5.8 K/uL (ref 4.0–10.5)

## 2023-12-18 MED ORDER — NA SULFATE-K SULFATE-MG SULF 17.5-3.13-1.6 GM/177ML PO SOLN: 1.0000 | Freq: Once | ORAL | 0 refills | Status: AC

## 2023-12-18 MED ORDER — LUBIPROSTONE 24 MCG PO CAPS: 24.0000 ug | ORAL_CAPSULE | Freq: Two times a day (BID) | ORAL | 3 refills | Status: DC

## 2023-12-18 NOTE — Progress Notes (Signed)
 Chief Complaint: IBS-C  HPI:    Randy Ford is an 83 year old male with a past medical history as listed below including anxiety, cystic lesion of the pancreas and others, known to Dr. Wilhelmenia, who presents to clinic today for follow-up of IBS-C.    09/2023 CTAP without contrast for constipation showed 1.5 x 1.4 cm decreased attenuation at the junction of the body and tail pancreas which could correlate with pancreatic cyst lesion on CTAP 09/2023.  Follow-up MRI showed fluid signal and cystic lesions in the ventral pancreatic tail measuring 1.4 x 1.3 cm in the ventral pancreatic neck measuring 1.1 x 1.0 cm no pancreatic ductal dilation or inflammatory changes.  Suspected sidebranch IPMN's.  (Dr. Wilhelmenia discussed that if the patient wanted to be aggressive with observation of this could consider repeat MRI/MRCP in a year to show stability before ending surveillance)    10/20/2023 patient seen in clinic by Con Blower, PA and at that time discussed his history of chronic constipation, initially started on a half a dose of MiraLAX and later increased to a full dose without significant improvement, then began using stool softeners.  Then saw his PCP who prescribed Linzess .  At time of that visit using a full dose of MiraLAX and 1 tablet of Linzess .  He had regular bowel movements almost every evening.  But still experiences sensation of incomplete evacuation.  At the time noted several diagnostic studies including colonoscopy several years ago which indicated slow stool movement, repeat imaging including a CT scan and MRI which showed a lot of stool in the colon and a lesion in the pancreas.  Anxiety related to this.  He tried using a Training and development officer.  At that visit patient increased Linzess  to 290 mcg and continued 1 capful of MiraLAX.  Discussed pelvic floor physical therapy for pelvic floor dyssynergy.    Today, patient presents to clinic accompanied by his wife and explains that he has had no real  change in his symptoms.  He has an elaborate routine in order to have a bowel movement every night which consists of multiple things including an enema.  Once he uses the enema this enables him to relax enough to go to sleep.  Sometimes he has residual effects from the enema throughout the night.  This has been going on for the past couple of months but is definitely a change over the past 6 months for sure.  A year ago he was having 3 bowel movements a day and a 24-hour period which was regular for him.  He has had no changes in medication, no increases in stress or anxiety and no new supplements.  He does take Mirtazapine but has been using this at night for a long time.  He also recently started adding fiber to his regimen of MiraLAX and Linzess  290 mcg.  He does have a full bowel movement every night, but continues to feel like there is residual stool.  He has been to the pelvic floor therapist once and has another appointment coming up next week.  Worried that something else is going on.    Denies fever, chills or blood in his stool.  No weight loss.  PREVIOUS GI WORKUP    Colonoscopy 03/2020 - Perianal skin tags found on perianal exam.  - Hemorrhoids found on digital rectal exam.  - Stool in the entire examined colon.  - Redundant colon.  - Two 2 to 3 mm polyps in the descending colon and in the ascending  colon, removed with a cold snare. Resected and retrieved.  - Normal mucosa in the entire examined colon.  - Non- bleeding non- thrombosed external and internal hemorrhoids. - repeat 7 years   Surgical [P], colon, descending x 1, ascending x 1, polyp (2) - TUBULAR ADENOMA WITHOUT HIGH-GRADE DYSPLASIA OR MALIGNANCY - OTHER FRAGMENTS OF POLYPOID COLONIC MUCOSA WITH PROMINENT LYMPHOID AGGREGATES  Past Medical History:  Diagnosis Date   Anemia    hx of    Anxiety    Arthritis    osteoarthritis rt foot; gout   Cataract    bilateral eye sx   Chest pain    denies presently; myoview,holter  monitor wnl; Dr Court cardiologist   Dysuria-frequency syndrome    Eczema    Foley catheter in place    uses self cath nightly as recommended by urologist at [facility]   High cholesterol    History of urinary tract infection    Hydronephrosis, bilateral    Hyperlipidemia    on meds   Hypertension    on meds   Hypothyroidism    on meds   IBS (irritable bowel syndrome)    Nocturia    OSA (obstructive sleep apnea) 08/08/2008   AHI 1.09/hr   Sleep apnea    uses CPAP   Tendonitis, Achilles, right    Wears glasses     Past Surgical History:  Procedure Laterality Date   benign lipomas on arms  Bilateral    removed 2016   CATARACT EXTRACTION     x2   CYSTOSCOPY W/ URETERAL STENT PLACEMENT Bilateral 10/06/2015   Procedure: CYSTOSCOPY WITH BILATERAL RETROGRADE PYELOGRAM BILATERAL URETERAL STENT PLACEMENT WITH LEFT URETEROSCOPY;  Surgeon: Morene LELON Salines, MD;  Location: [facility];  Service: Urology;  Laterality: Bilateral;   EYE SURGERY Bilateral    detached retina; 3 on left eye; once on right   MASS EXCISION Bilateral    fatty tumors rmoved from arms   NASAL SEPTOPLASTY W/ TURBINOPLASTY Bilateral 05/15/2016   Procedure: NASAL SEPTOPLASTY WITH BILATERAL INFERIOR TURBINATE REDUCTION;  Surgeon: Alm Bouche, MD;  Location: [facility] OR;  Service: ENT;  Laterality: Bilateral;   PROSTATECTOMY N/A 12/15/2015   Procedure: OPEN SIMPLE PROSTATECTOMY;  Surgeon: Morene LELON Salines, MD;  Location: [facility];  Service: Urology;  Laterality: N/A;   TONSILLECTOMY  1948   WISDOM TOOTH EXTRACTION      Current Outpatient Medications  Medication Sig Dispense Refill   amLODipine  (NORVASC ) 5 MG tablet TAKE ONE TABLET EACH DAY 90 tablet 1   docusate sodium  (COLACE) 50 MG capsule Take 100 mg by mouth daily.     levothyroxine  (SYNTHROID , LEVOTHROID) 75 MCG tablet Take 75 mcg by mouth daily.       linaclotide  (LINZESS ) 290 MCG CAPS capsule Take 1 capsule (290 mcg total) by mouth daily  before breakfast. 30 capsule 4   mirtazapine (REMERON) 15 MG tablet Take 15 mg by mouth at bedtime as needed.      Omega-3 Fatty Acids (FISH OIL OMEGA-3 PO) Take 1 Capful by mouth in the morning and at bedtime.     polyethylene glycol (MIRALAX / GLYCOLAX) 17 g packet Take 17 g by mouth daily.      Potassium 99 MG TABS Take 1 tablet by mouth daily.     psyllium (METAMUCIL) 58.6 % packet Take 1 packet by mouth daily.     simvastatin  (ZOCOR ) 20 MG tablet TAKE ONE TABLET EACH DAY 90 tablet 1   Vitamin D, Cholecalciferol,  1000 UNITS TABS Take 1 tablet by mouth daily.     No current facility-administered medications for this visit.    Allergies as of 12/18/2023   (No Known Allergies)    Family History  Problem Relation Age of Onset   Dementia Mother    Bladder Cancer Mother 67   Cancer Father 50   Lung cancer Father 17   Prostate cancer Brother 51   Colon polyps Neg Hx    Colon cancer Neg Hx    Esophageal cancer Neg Hx    Stomach cancer Neg Hx    Rectal cancer Neg Hx     Social History   Socioeconomic History   Marital status: Married    Spouse name: Not on file   Number of children: Not on file   Years of education: Not on file   Highest education level: Not on file  Occupational History   Occupation: Retired    Comment: Runner, broadcasting/film/video   Occupation: retired  Tobacco Use   Smoking status: Never   Smokeless tobacco: Never  Advertising account planner   Vaping status: Never Used  Substance and Sexual Activity   Alcohol use: Not Currently    Comment: 1/2 beer once a month   Drug use: No   Sexual activity: Not on file  Other Topics Concern   Not on file  Social History Narrative   Not on file   Social Drivers of Health   Financial Resource Strain: Not on file  Food Insecurity: Not on file  Transportation Needs: Not on file  Physical Activity: Not on file  Stress: Not on file  Social Connections: Not on file  Intimate Partner Violence: Not on file    Review of Systems:     Constitutional: No weight loss, fever or chills Cardiovascular: No chest pain  Respiratory: No SOB  Gastrointestinal: See HPI and otherwise negative   Physical Exam:  Vital signs: BP 120/60 (BP Location: Left Arm, Patient Position: Sitting, Cuff Size: Normal)   Pulse 68   Ht 5' 9 (1.753 m) Comment: height measured without shoes  Wt 182 lb 8 oz (82.8 kg)   BMI 26.95 kg/m    Constitutional:   Pleasant Caucasian male appears to be in NAD, Well developed, Well nourished, alert and cooperative Respiratory: Respirations even and unlabored. Lungs clear to auscultation bilaterally.   No wheezes, crackles, or rhonchi.  Cardiovascular: Normal S1, S2. No MRG. Regular rate and rhythm. No peripheral edema, cyanosis or pallor.  Gastrointestinal:  Soft, nondistended, nontender. No rebound or guarding. Decreased BS all four quads. No appreciable masses or hepatomegaly. Rectal:  Not performed.  Psychiatric: Demonstrates good judgement and reason without abnormal affect or behaviors.  RELEVANT LABS AND IMAGING: CBC    Component Value Date/Time   WBC 5.4 05/09/2016 1134   RBC 4.22 05/09/2016 1134   HGB 12.3 (L) 05/09/2016 1134   HCT 35.8 (L) 05/09/2016 1134   PLT 279 05/09/2016 1134   MCV 84.8 05/09/2016 1134   MCH 29.1 05/09/2016 1134   MCHC 34.4 05/09/2016 1134   RDW 14.6 05/09/2016 1134   LYMPHSABS 2.2 12/24/2010 1523   MONOABS 0.8 12/24/2010 1523   EOSABS 0.2 12/24/2010 1523   BASOSABS 0.0 12/24/2010 1523    CMP     Component Value Date/Time   NA 138 05/09/2016 1134   K 4.0 05/09/2016 1134   CL 101 05/09/2016 1134   CO2 27 05/09/2016 1134   GLUCOSE 93 05/09/2016 1134   BUN 17 05/09/2016 1134  CREATININE 1.26 (H) 05/09/2016 1134   CALCIUM  9.6 05/09/2016 1134   PROT 7.3 03/08/2019 0813   ALBUMIN 4.6 03/08/2019 0813   AST 23 03/08/2019 0813   ALT 25 03/08/2019 0813   ALKPHOS 127 (H) 03/08/2019 0813   BILITOT 0.3 03/08/2019 0813   GFRNONAA 55 (L) 05/09/2016 1134   GFRAA >60  05/09/2016 1134    Assessment: 1.  Change in bowel habits: Patient describes this change over the past year, worse now over the past couple of months requiring an enema every night before he goes to sleep in order to feel comfortable enough to lay down and go to bed, this is in addition to MiraLAX daily, fiber supplement and Linzess  290 mcg which was just increased, also seeing pelvic floor physical therapy with no change in symptoms after 1 visit; concern for large polyp or other mechanical obstruction +/- worsened IBS-C/slow transit  Plan: 1.  At this point patient has growing concerned about symptoms.  Scheduled him for a diagnostic colonoscopy in the [facility] with Dr. Wilhelmenia.  Did provide the patient with a detailed list of risks for the procedure and he agrees to proceed. Patient is appropriate for endoscopic procedure(s) in the ambulatory ([facility]) setting.  2.  Stop Linzess , will trial Amitiza  24 mcg twice daily, 30-60 minutes for breakfast and dinner.  #60 with 3 refills. 3.  Can continue MiraLAX and fiber supplement and/or enema if needed. 4.  Continue working with physical therapy 5.  Update CBC and CMP today as he has had no recent labs 6.  Patient to follow in clinic per recommendations after time of procedure.  Randy Failing, PA-C [facility] 12/18/2023, 10:23 AM  Cc: Rexanne Ingle, MD

## 2023-12-18 NOTE — Patient Instructions (Signed)
 Your provider has requested that you go to the basement level for lab work before leaving today. Press B on the elevator. The lab is located at the first door on the left as you exit the elevator.  Stop Linzess .  We have sent the following medications to your pharmacy for you to pick up at your convenience: Amitiza  24 mcg twice daily.   You have been scheduled for a colonoscopy. Please follow written instructions given to you at your visit today.   If you use inhalers (even only as needed), please bring them with you on the day of your procedure.  DO NOT TAKE 7 DAYS PRIOR TO TEST- Trulicity (dulaglutide) Ozempic, Wegovy (semaglutide) Mounjaro (tirzepatide) Bydureon Bcise (exanatide extended release)  DO NOT TAKE 1 DAY PRIOR TO YOUR TEST Rybelsus (semaglutide) Adlyxin (lixisenatide) Victoza (liraglutide) Byetta (exanatide) ___________________________________________________________________________

## 2023-12-19 NOTE — Progress Notes (Signed)
 Attending Physician's Attestation   I have reviewed the chart.   I agree with the Advanced Practitioner's note, impression, and recommendations with any updates as below. If colonoscopy negative then consider Ibsrela or Trulance or Motegrity in future for constipation.  He should continue pelvic floor physical therapy.  Will consider anorectal manometry.   Aloha Finner, MD [facility] Advanced Endoscopy Office # [phone]

## 2023-12-22 ENCOUNTER — Ambulatory Visit: Admitting: Physical Therapy

## 2023-12-22 ENCOUNTER — Ambulatory Visit: Attending: Gastroenterology | Admitting: Physical Therapy

## 2023-12-22 ENCOUNTER — Encounter: Payer: Self-pay | Admitting: Physical Therapy

## 2023-12-22 DIAGNOSIS — M6281 Muscle weakness (generalized): Secondary | ICD-10-CM | POA: Diagnosis not present

## 2023-12-22 DIAGNOSIS — R293 Abnormal posture: Secondary | ICD-10-CM | POA: Diagnosis not present

## 2023-12-22 DIAGNOSIS — R279 Unspecified lack of coordination: Secondary | ICD-10-CM | POA: Diagnosis not present

## 2023-12-22 NOTE — Therapy (Signed)
 OUTPATIENT PHYSICAL THERAPY MALE PELVIC TREATMENT   Patient Name: Randy Ford MRN: [id number] DOB:Oct 04, 1940, 83 y.o., male Today's Date: 12/22/2023  END OF SESSION:  PT End of Session - 12/22/23 1449     Visit Number 2    Date for PT Re-Evaluation 03/24/24    Authorization Type [employer] MCR    Progress Note Due on Visit 10    PT Start Time 1446    PT Stop Time 1527    PT Time Calculation (min) 41 min    Activity Tolerance Patient tolerated treatment well    Behavior During Therapy WFL for tasks assessed/performed           Past Medical History:  Diagnosis Date   Anemia    hx of    Anxiety    Arthritis    osteoarthritis rt foot; gout   Cataract    bilateral eye sx   Chest pain    denies presently; myoview,holter monitor wnl; Dr Court cardiologist   Dysuria-frequency syndrome    Eczema    Foley catheter in place    uses self cath nightly as recommended by urologist at [facility]   High cholesterol    History of urinary tract infection    Hydronephrosis, bilateral    Hyperlipidemia    on meds   Hypertension    on meds   Hypothyroidism    on meds   IBS (irritable bowel syndrome)    Nocturia    OSA (obstructive sleep apnea) 08/08/2008   AHI 1.09/hr   Sleep apnea    uses CPAP   Tendonitis, Achilles, right    Wears glasses    Past Surgical History:  Procedure Laterality Date   benign lipomas on arms  Bilateral    removed 2016   CATARACT EXTRACTION     x2   CYSTOSCOPY W/ URETERAL STENT PLACEMENT Bilateral 10/06/2015   Procedure: CYSTOSCOPY WITH BILATERAL RETROGRADE PYELOGRAM BILATERAL URETERAL STENT PLACEMENT WITH LEFT URETEROSCOPY;  Surgeon: Morene LELON Salines, MD;  Location: [facility];  Service: Urology;  Laterality: Bilateral;   EYE SURGERY Bilateral    detached retina; 3 on left eye; once on right   MASS EXCISION Bilateral    fatty tumors rmoved from arms   NASAL SEPTOPLASTY W/ TURBINOPLASTY Bilateral 05/15/2016   Procedure: NASAL  SEPTOPLASTY WITH BILATERAL INFERIOR TURBINATE REDUCTION;  Surgeon: Alm Bouche, MD;  Location: [facility] OR;  Service: ENT;  Laterality: Bilateral;   PROSTATECTOMY N/A 12/15/2015   Procedure: OPEN SIMPLE PROSTATECTOMY;  Surgeon: Morene LELON Salines, MD;  Location: [facility];  Service: Urology;  Laterality: N/A;   TONSILLECTOMY  1948   WISDOM TOOTH EXTRACTION     Patient Active Problem List   Diagnosis Date Noted   Macular pucker, right eye 08/03/2020   Right retinal lattice degeneration 08/03/2020   Retinal hemorrhage of left eye 08/03/2020   Degenerative retinal drusen of right eye 08/03/2020   History of retinal detachment 08/03/2020   History of vitrectomy 08/03/2020   BPH (benign prostatic hyperplasia) 12/15/2015   Essential hypertension 08/01/2014   Hyperlipidemia 08/01/2014   Obstructive sleep apnea 08/01/2014   Chest pain 08/01/2014   Palpitations 08/01/2014   BMI 28.0-28.9,adult 03/06/2013   Constipation 12/27/2010    PCP: Rexanne Ingle, MD   REFERRING PROVIDER: Mollie Nestor HERO, PA-C   REFERRING DIAG: K58.1 (ICD-10-CM) - Irritable bowel syndrome with constipation  THERAPY DIAG:  Muscle weakness (generalized)  Abnormal posture  Unspecified lack of coordination  Rationale for Evaluation and  Treatment: Rehabilitation  ONSET DATE: a couple months  SUBJECTIVE:                                                                                                                                                                                           SUBJECTIVE STATEMENT: Changed from linzess  2 days ago. And constipation the same. Doing same routine for bowel emptying.   Fluid intake: 2 quarts; ice tea and milk and coffee daily usually one of each  PAIN:  Are you having pain? No   PRECAUTIONS: None  RED FLAGS: None   WEIGHT BEARING RESTRICTIONS: No  FALLS:  Has patient fallen in last 6 months? No  LIVING ENVIRONMENT: Lives with: lives with their family Lives  in: House/apartment  OCCUPATION: retired - Paramedic   PLOF: Independent  PATIENT GOALS: to have regular bowel movements  PERTINENT HISTORY:  PROSTATECTOMY, constipation  Sexual abuse:NO  BOWEL MOVEMENT: Pain with bowel movement: No Type of bowel movement:Type (Bristol Stool Scale) 5, Frequency once daily, and Strain sometimes straining  Fully empty rectum: No Leakage: No Pads: No Fiber supplement: Yes: metamucil   URINATION: Pain with urination: No Fully empty bladder: Yes:   Stream: Strong Urgency: No Frequency: not quicker than every 2 hours; 1x nightly Leakage: none Pads: No  INTERCOURSE: Pain with intercourse: none    OBJECTIVE:  Note: Objective measures were completed at Evaluation unless otherwise noted.  DIAGNOSTIC FINDINGS:    PATIENT SURVEYS:    PFIQ-7 24 PFIQ-7 - 21  COGNITION: Overall cognitive status: Within functional limits for tasks assessed     SENSATION: Light touch: Appears intact Proprioception: Appears intact  MUSCLE LENGTH: Bil hamstrings limited by 25%    GAIT: Trunk flexion and decreased step height bil  POSTURE: rounded shoulders, forward head, increased thoracic kyphosis, and posterior pelvic tilt  PELVIC ALIGNMENT: WFL  LUMBARAROM/PROM:  A/PROM A/PROM  eval  Flexion WFL  Extension WFL  Right lateral flexion Limited by 25%  Left lateral flexion Limited by 25%  Right rotation Limited by 25%  Left rotation Limited by 25%   (Blank rows = not tested)  LOWER EXTREMITY AROM/PROM:  [facility]  LOWER EXTREMITY MMT:  Hips grossly 4+/5; knees 4+/5  PALPATION: GENERAL tightness in bil thoracic and lumbar spine              External Perineal Exam deferred               Internal Pelvic Floor deferred  Patient confirms identification and approves PT to assess internal pelvic floor and treatment No  PELVIC MMT:   MMT 12/22/23  Internal Anal Sphincter  External Anal Sphincter   Puborectalis   Diastasis Recti    (Blank rows = not tested)  TONE: Deferred   TODAY'S TREATMENT:                                                                                                                              DATE:   5/22 EVAL Examination completed, findings reviewed, pt educated on POC, HEP, and fiber types, voiding mechanics, balloon breathing, abdominal massage. Pt motivated to participate in PT and agreeable to attempt recommendations.    12/22/43: Patient consented to internal pelvic floor assessment/treatment rectally this date and found to have decreased strength, endurance, and coordination. Patient benefited from verbal cues for improved technique with pelvic floor mechanics as pt does demonstrate ability to contract however inability to bulge/bear down. Pt cued to do what you do when you have to empty bowels and he demonstrated straining but full strength pelvic floor contraction. Pt educated on this and attempted to correct however no contraction or bearing down, then with balloon breathing techniques pt demonstrated greatly improved mechanics with ability to bear down without needing to strain. This was completed x10 with consistent improvement noted.  2x10 contractions 2x10 quick flicks X5 30s holds 2x10 balloon breathing cues for relaxation Pt educated on possibility of changing his schedule for attempting to improve bowel schedule for earlier in the evening to allow himself more time to attempt additional void if needed instead of using enema immediately after bowel movement. Pt agreed to attempt. Pt does report he enjoys his evening routine currently and educated to attempt bowel massage during this or relaxation /breathing techniques during this instead of separately to allow more time free for him and wife.  Pt reports his biggest priority and concern is not losing sleep at night as he needs to wake and take 4 medications at 4 different times, PT encouraged pt to ask MD about this or possible changes  if able to allow pt more sleep.  Pt agreed to ask     PATIENT EDUCATION:  Education details: [id number];  fiber types, voiding mechanics, balloon breathing, abdominal massage Person educated: Patient Education method: Explanation, Demonstration, Tactile cues, Verbal cues, and Handouts Education comprehension: verbalized understanding, returned demonstration, verbal cues required, tactile cues required, and needs further education  HOME EXERCISE PROGRAM: [id number]  ASSESSMENT:  CLINICAL IMPRESSION: Patient is a 83 y.o. male  who was seen today for physical therapy  treatment for constipation. Pt session focused on internal rectal assessment and treatment, pt does demonstrate mild tension throughout pelvic floor but not TTP. Pt does demonstrate contraction with attempts to bulge/bear down but improved with cues and reps. Pt educated on this and techniques to improve bowel habits.  Pt would benefit from additional PT to further address deficits.     OBJECTIVE IMPAIRMENTS: decreased coordination, decreased endurance, decreased strength, impaired flexibility, and postural dysfunction.   ACTIVITY LIMITATIONS: continence  PARTICIPATION LIMITATIONS: community activity  PERSONAL FACTORS:  Time since onset of injury/illness/exacerbation are also affecting patient's functional outcome.   REHAB POTENTIAL: Good  CLINICAL DECISION MAKING: Stable/uncomplicated  EVALUATION COMPLEXITY: Low   GOALS: Goals reviewed with patient? Yes  SHORT TERM GOALS: Target date: 11/20/23  .Pt to be I with HEP for carry over and continuing recommendations for improved outcomes.   Baseline: Goal status: MET  2.  Pt will be independent with use of squatty potty, relaxed toileting mechanics, and improved bowel movement techniques in order to increase ease of bowel movements and complete evacuation.   Baseline:  Goal status: MET   LONG TERM GOALS: Target date: 01/23/24  Pt to be I with advanced HEP for carry  over and continuing recommendations for improved outcomes.   Baseline:  Goal status: on going  2.  Pt will report 6 BMs per week due to improved muscle tone and coordination with bowel movements with less frequent need of bowel stimulants.  Baseline:  Goal status: on going - met 6 reps but needs same stimulants   3.  Pt to report no straining for bowel movement at least 75% of the time to decreased strain at pelvic floor.  Baseline:  Goal status: on going  4.pt to demonstrate no restrictions in trunk and hip mobility to decreased restrictions at bowels and improve bowel habits.   Baseline:  Goal status: on going PLAN:  PT FREQUENCY: every other week  PT DURATION: 4 sessions  PLANNED INTERVENTIONS: 97110-Therapeutic exercises, 97530- Therapeutic activity, 97112- Neuromuscular re-education, 97535- Self Care, [id number]- Manual therapy, [phone]- Aquatic Therapy, Patient/Family education, Taping, Dry Needling, Joint mobilization, Spinal mobilization, Scar mobilization, DME instructions, Cryotherapy, Moist heat, and Biofeedback  PLAN FOR NEXT SESSION: abdominal massage, trunk mobility, internal if needed     Darryle Navy, PT, DPT 07/21/254:07 PM

## 2023-12-23 NOTE — Addendum Note (Signed)
 Addended by: Bethan Adamek S on: 12/23/2023 10:57 AM   Modules accepted: Orders

## 2023-12-29 ENCOUNTER — Ambulatory Visit: Admitting: Physical Therapy

## 2023-12-31 DIAGNOSIS — D2271 Melanocytic nevi of right lower limb, including hip: Secondary | ICD-10-CM | POA: Diagnosis not present

## 2023-12-31 DIAGNOSIS — L905 Scar conditions and fibrosis of skin: Secondary | ICD-10-CM | POA: Diagnosis not present

## 2023-12-31 DIAGNOSIS — D225 Melanocytic nevi of trunk: Secondary | ICD-10-CM | POA: Diagnosis not present

## 2023-12-31 DIAGNOSIS — Z85828 Personal history of other malignant neoplasm of skin: Secondary | ICD-10-CM | POA: Diagnosis not present

## 2023-12-31 DIAGNOSIS — D485 Neoplasm of uncertain behavior of skin: Secondary | ICD-10-CM | POA: Diagnosis not present

## 2023-12-31 DIAGNOSIS — D2272 Melanocytic nevi of left lower limb, including hip: Secondary | ICD-10-CM | POA: Diagnosis not present

## 2023-12-31 DIAGNOSIS — L821 Other seborrheic keratosis: Secondary | ICD-10-CM | POA: Diagnosis not present

## 2023-12-31 DIAGNOSIS — L570 Actinic keratosis: Secondary | ICD-10-CM | POA: Diagnosis not present

## 2024-01-14 DIAGNOSIS — H52203 Unspecified astigmatism, bilateral: Secondary | ICD-10-CM | POA: Diagnosis not present

## 2024-01-14 DIAGNOSIS — H31003 Unspecified chorioretinal scars, bilateral: Secondary | ICD-10-CM | POA: Diagnosis not present

## 2024-01-22 ENCOUNTER — Encounter: Payer: Self-pay | Admitting: Gastroenterology

## 2024-02-03 ENCOUNTER — Ambulatory Visit (AMBULATORY_SURGERY_CENTER): Admitting: Gastroenterology

## 2024-02-03 ENCOUNTER — Encounter: Payer: Self-pay | Admitting: Gastroenterology

## 2024-02-03 VITALS — BP 149/82 | HR 52 | Temp 98.2°F | Resp 12 | Ht 69.0 in | Wt 182.8 lb

## 2024-02-03 DIAGNOSIS — K644 Residual hemorrhoidal skin tags: Secondary | ICD-10-CM

## 2024-02-03 DIAGNOSIS — Z1211 Encounter for screening for malignant neoplasm of colon: Secondary | ICD-10-CM | POA: Diagnosis not present

## 2024-02-03 DIAGNOSIS — K6389 Other specified diseases of intestine: Secondary | ICD-10-CM | POA: Diagnosis not present

## 2024-02-03 DIAGNOSIS — G4733 Obstructive sleep apnea (adult) (pediatric): Secondary | ICD-10-CM | POA: Diagnosis not present

## 2024-02-03 DIAGNOSIS — Z860100 Personal history of colon polyps, unspecified: Secondary | ICD-10-CM | POA: Diagnosis not present

## 2024-02-03 DIAGNOSIS — Q438 Other specified congenital malformations of intestine: Secondary | ICD-10-CM | POA: Diagnosis not present

## 2024-02-03 DIAGNOSIS — K641 Second degree hemorrhoids: Secondary | ICD-10-CM

## 2024-02-03 DIAGNOSIS — K562 Volvulus: Secondary | ICD-10-CM

## 2024-02-03 DIAGNOSIS — I10 Essential (primary) hypertension: Secondary | ICD-10-CM | POA: Diagnosis not present

## 2024-02-03 DIAGNOSIS — K581 Irritable bowel syndrome with constipation: Secondary | ICD-10-CM

## 2024-02-03 DIAGNOSIS — E039 Hypothyroidism, unspecified: Secondary | ICD-10-CM | POA: Diagnosis not present

## 2024-02-03 DIAGNOSIS — R194 Change in bowel habit: Secondary | ICD-10-CM

## 2024-02-03 DIAGNOSIS — K5900 Constipation, unspecified: Secondary | ICD-10-CM

## 2024-02-03 MED ORDER — SODIUM CHLORIDE 0.9 % IV SOLN: 500.0000 mL | Freq: Once | INTRAVENOUS | Status: DC

## 2024-02-03 NOTE — Progress Notes (Signed)
Updated medical record with pt

## 2024-02-03 NOTE — Progress Notes (Signed)
 Vss nad trans to pacu

## 2024-02-03 NOTE — Patient Instructions (Addendum)
 Resume all of your previous medications today as ordered. Read your discharge instructions. Use fibercon 1-2 tablets daily. Or metamucil. D?C the Amitiza .  Use the IBSrela as ordered for two weeks. Take before breakfast and dinner. Samples given by Dr Isa. Let us  know in 2 weeks if the medication works.   YOU HAD AN ENDOSCOPIC PROCEDURE TODAY AT THE [facility]:   Refer to the procedure report that was given to you for any specific questions about what was found during the examination.  If the procedure report does not answer your questions, please call your gastroenterologist to clarify.  If you requested that your care partner not be given the details of your procedure findings, then the procedure report has been included in a sealed envelope for you to review at your convenience later.  YOU SHOULD EXPECT: Some feelings of bloating in the abdomen. Passage of more gas than usual.  Walking can help get rid of the air that was put into your GI tract during the procedure and reduce the bloating. If you had a lower endoscopy (such as a colonoscopy or flexible sigmoidoscopy) you may notice spotting of blood in your stool or on the toilet paper. If you underwent a bowel prep for your procedure, you may not have a normal bowel movement for a few days.  Please Note:  You might notice some irritation and congestion in your nose or some drainage.  This is from the oxygen used during your procedure.  There is no need for concern and it should clear up in a day or so.  SYMPTOMS TO REPORT IMMEDIATELY:  Following lower endoscopy (colonoscopy or flexible sigmoidoscopy):  Excessive amounts of blood in the stool  Significant tenderness or worsening of abdominal pains  Swelling of the abdomen that is new, acute  Fever of 100F or higher   For urgent or emergent issues, a gastroenterologist can be reached at any hour by calling [phone]. Do not use MyChart messaging for urgent  concerns.    DIET:  We do recommend a small meal at first, but then you may proceed to your regular diet.  Drink plenty of fluids but you should avoid alcoholic beverages for 24 hours.  ACTIVITY:  You should plan to take it easy for the rest of today and you should NOT DRIVE or use heavy machinery until tomorrow (because of the sedation medicines used during the test).    FOLLOW UP: Our staff will call the number listed on your records the next business day following your procedure.  We will call around 7:15- 8:00 am to check on you and address any questions or concerns that you may have regarding the information given to you following your procedure. If we do not reach you, we will leave a message.      SIGNATURES/CONFIDENTIALITY: You and/or your care partner have signed paperwork which will be entered into your electronic medical record.  These signatures attest to the fact that that the information above on your After Visit Summary has been reviewed and is understood.  Full responsibility of the confidentiality of this discharge information lies with you and/or your care-partner.

## 2024-02-03 NOTE — Progress Notes (Signed)
 GASTROENTEROLOGY PROCEDURE H&P NOTE   Primary Care Physician: Rexanne Ingle, MD  HPI: Randy Ford is a 83 y.o. male who presents for change in bowel habits rule out obstruction.  Past Medical History:  Diagnosis Date   Anemia    hx of    Anxiety    Arthritis    osteoarthritis rt foot; gout   Cataract    bilateral eye sx   Chest pain    denies presently; myoview,holter monitor wnl; Dr Court cardiologist   Dysuria-frequency syndrome    Eczema    Foley catheter in place    uses self cath nightly as recommended by urologist at [facility]   High cholesterol    History of urinary tract infection    Hydronephrosis, bilateral    Hyperlipidemia    on meds   Hypertension    on meds   Hypothyroidism    on meds   IBS (irritable bowel syndrome)    Nocturia    OSA (obstructive sleep apnea) 08/08/2008   AHI 1.09/hr   Sleep apnea    uses CPAP   Tendonitis, Achilles, right    Wears glasses    Past Surgical History:  Procedure Laterality Date   benign lipomas on arms  Bilateral    removed 2016   CATARACT EXTRACTION     x2   CYSTOSCOPY W/ URETERAL STENT PLACEMENT Bilateral 10/06/2015   Procedure: CYSTOSCOPY WITH BILATERAL RETROGRADE PYELOGRAM BILATERAL URETERAL STENT PLACEMENT WITH LEFT URETEROSCOPY;  Surgeon: Morene LELON Salines, MD;  Location: [facility];  Service: Urology;  Laterality: Bilateral;   EYE SURGERY Bilateral    detached retina; 3 on left eye; once on right   MASS EXCISION Bilateral    fatty tumors rmoved from arms   NASAL SEPTOPLASTY W/ TURBINOPLASTY Bilateral 05/15/2016   Procedure: NASAL SEPTOPLASTY WITH BILATERAL INFERIOR TURBINATE REDUCTION;  Surgeon: Alm Bouche, MD;  Location: [facility] OR;  Service: ENT;  Laterality: Bilateral;   PROSTATECTOMY N/A 12/15/2015   Procedure: OPEN SIMPLE PROSTATECTOMY;  Surgeon: Morene LELON Salines, MD;  Location: [facility];  Service: Urology;  Laterality: N/A;   TONSILLECTOMY  1948   WISDOM TOOTH EXTRACTION      Current Outpatient Medications  Medication Sig Dispense Refill   amLODipine  (NORVASC ) 5 MG tablet TAKE ONE TABLET EACH DAY 90 tablet 1   docusate sodium  (COLACE) 50 MG capsule Take 100 mg by mouth daily.     levothyroxine  (SYNTHROID , LEVOTHROID) 75 MCG tablet Take 75 mcg by mouth daily.       lubiprostone  (AMITIZA ) 24 MCG capsule Take 1 capsule (24 mcg total) by mouth 2 (two) times daily with a meal. 60 capsule 3   mirtazapine (REMERON) 15 MG tablet Take 15 mg by mouth at bedtime as needed.      Omega-3 Fatty Acids (FISH OIL OMEGA-3 PO) Take 1 Capful by mouth in the morning and at bedtime.     polyethylene glycol (MIRALAX / GLYCOLAX) 17 g packet Take 17 g by mouth daily.      Potassium 99 MG TABS Take 1 tablet by mouth daily.     psyllium (METAMUCIL) 58.6 % packet Take 1 packet by mouth daily.     simvastatin  (ZOCOR ) 20 MG tablet TAKE ONE TABLET EACH DAY 90 tablet 1   sodium phosphate  Pediatric (FLEET) 3.5-9.5 GM/59ML enema Place 1 enema rectally at bedtime.     Vitamin D, Cholecalciferol, 1000 UNITS TABS Take 1 tablet by mouth daily.     No current  facility-administered medications for this visit.    Current Outpatient Medications:    amLODipine  (NORVASC ) 5 MG tablet, TAKE ONE TABLET EACH DAY, Disp: 90 tablet, Rfl: 1   docusate sodium  (COLACE) 50 MG capsule, Take 100 mg by mouth daily., Disp: , Rfl:    levothyroxine  (SYNTHROID , LEVOTHROID) 75 MCG tablet, Take 75 mcg by mouth daily.  , Disp: , Rfl:    lubiprostone  (AMITIZA ) 24 MCG capsule, Take 1 capsule (24 mcg total) by mouth 2 (two) times daily with a meal., Disp: 60 capsule, Rfl: 3   mirtazapine (REMERON) 15 MG tablet, Take 15 mg by mouth at bedtime as needed. , Disp: , Rfl:    Omega-3 Fatty Acids (FISH OIL OMEGA-3 PO), Take 1 Capful by mouth in the morning and at bedtime., Disp: , Rfl:    polyethylene glycol (MIRALAX / GLYCOLAX) 17 g packet, Take 17 g by mouth daily. , Disp: , Rfl:    Potassium 99 MG TABS, Take 1 tablet by mouth  daily., Disp: , Rfl:    psyllium (METAMUCIL) 58.6 % packet, Take 1 packet by mouth daily., Disp: , Rfl:    simvastatin  (ZOCOR ) 20 MG tablet, TAKE ONE TABLET EACH DAY, Disp: 90 tablet, Rfl: 1   sodium phosphate  Pediatric (FLEET) 3.5-9.5 GM/59ML enema, Place 1 enema rectally at bedtime., Disp: , Rfl:    Vitamin D, Cholecalciferol, 1000 UNITS TABS, Take 1 tablet by mouth daily., Disp: , Rfl:  No Known Allergies Family History  Problem Relation Age of Onset   Dementia Mother    Bladder Cancer Mother 75   Cancer Father 67   Lung cancer Father 61   Prostate cancer Brother 51   Colon polyps Neg Hx    Colon cancer Neg Hx    Esophageal cancer Neg Hx    Stomach cancer Neg Hx    Rectal cancer Neg Hx    Social History   Socioeconomic History   Marital status: Married    Spouse name: Not on file   Number of children: Not on file   Years of education: Not on file   Highest education level: Not on file  Occupational History   Occupation: Retired    Comment: Runner, broadcasting/film/video   Occupation: retired  Tobacco Use   Smoking status: Never   Smokeless tobacco: Never  Advertising account planner   Vaping status: Never Used  Substance and Sexual Activity   Alcohol use: Not Currently    Comment: 1/2 beer once a month   Drug use: No   Sexual activity: Not on file  Other Topics Concern   Not on file  Social History Narrative   Not on file   Social Drivers of Health   Financial Resource Strain: Not on file  Food Insecurity: Not on file  Transportation Needs: Not on file  Physical Activity: Not on file  Stress: Not on file  Social Connections: Not on file  Intimate Partner Violence: Not on file    Physical Exam: There were no vitals filed for this visit. There is no height or weight on file to calculate BMI. GEN: NAD EYE: Sclerae anicteric ENT: MMM CV: Non-tachycardic GI: Soft, NT/ND NEURO:  Alert & Oriented x 3  Lab Results: No results for input(s): WBC, HGB, HCT, PLT in the last 72  hours. BMET No results for input(s): NA, K, CL, CO2, GLUCOSE, BUN, CREATININE, CALCIUM  in the last 72 hours. LFT No results for input(s): PROT, ALBUMIN, AST, ALT, ALKPHOS, BILITOT, BILIDIR, IBILI in the last 72  hours. PT/INR No results for input(s): LABPROT, INR in the last 72 hours.   Impression / Plan: This is a 83 y.o.male who presents for change in bowel habits rule out obstruction.  The risks and benefits of endoscopic evaluation/treatment were discussed with the patient and/or family; these include but are not limited to the risk of perforation, infection, bleeding, missed lesions, lack of diagnosis, severe illness requiring hospitalization, as well as anesthesia and sedation related illnesses.  The patient's history has been reviewed, patient examined, no change in status, and deemed stable for procedure.  The patient and/or family is agreeable to proceed.    Aloha Finner, MD [facility] Advanced [facility]oscopy Office # [phone]

## 2024-02-03 NOTE — Op Note (Signed)
 [facility] Patient Name: Randy Ford Procedure Date: 02/03/2024 10:32 AM MRN: [id number] Endoscopist: Aloha Finner , MD, [id number] Age: 83 Referring MD:  Date of Birth: 12-03-1940 Gender: Male Account #: [id number] Procedure:                Colonoscopy Indications:              High risk colon cancer surveillance: Personal                            history of colonic polyps, Incidental change in                            bowel habits noted to more significant constipation                            and incomplete evacuation Medicines:                Monitored Anesthesia Care Procedure:                Pre-Anesthesia Assessment:                           - Prior to the procedure, a History and Physical                            was performed, and patient medications and                            allergies were reviewed. The patient's tolerance of                            previous anesthesia was also reviewed. The risks                            and benefits of the procedure and the sedation                            options and risks were discussed with the patient.                            All questions were answered, and informed consent                            was obtained. Prior Anticoagulants: The patient has                            taken no anticoagulant or antiplatelet agents. ASA                            Grade Assessment: III - A patient with severe                            systemic disease. After reviewing the risks and  benefits, the patient was deemed in satisfactory                            condition to undergo the procedure.                           After obtaining informed consent, the colonoscope                            was passed under direct vision. Throughout the                            procedure, the patient's blood pressure, pulse, and                            oxygen saturations were monitored  continuously. The                            Olympus Scope SN [id number] was introduced through the                            anus and advanced to the the cecum, identified by                            appendiceal orifice and ileocecal valve. The                            colonoscopy was somewhat difficult due to a                            redundant colon and significant looping. Successful                            completion of the procedure was aided by changing                            the patient's position, changing the patient to a                            supine position, using manual pressure,                            straightening and shortening the scope to obtain                            bowel loop reduction and using scope torsion. The                            patient tolerated the procedure. The quality of the                            bowel preparation was adequate. The ileocecal  valve, appendiceal orifice, and rectum were                            photographed. Scope In: 10:41:39 AM Scope Out: 11:07:30 AM Scope Withdrawal Time: 0 hours 8 minutes 30 seconds  Total Procedure Duration: 0 hours 25 minutes 51 seconds  Findings:                 Skin tags were found on perianal exam.                           The digital rectal exam findings include                            hemorrhoids. Pertinent negatives include no                            palpable rectal lesions.                           The colon (entire examined portion) was grossly                            redundant and looping.                           Diffuse mild melanosis was found in the entire                            colon.                           Non-bleeding non-thrombosed external and internal                            hemorrhoids were found during retroflexion, during                            perianal exam and during digital exam. The                             hemorrhoids were Grade II (internal hemorrhoids                            that prolapse but reduce spontaneously). Complications:            No immediate complications. Estimated Blood Loss:     Estimated blood loss: none. Impression:               - Perianal skin tags found on perianal exam.                           - Hemorrhoids found on digital rectal exam.                           - Grossly redundant and looping colon.                           -  Melanosis in the colon.                           - Non-bleeding non-thrombosed external and internal                            hemorrhoids. Recommendation:           - The patient will be observed post-procedure,                            until all discharge criteria are met.                           - Discharge patient to home.                           - Patient has a contact number available for                            emergencies. The signs and symptoms of potential                            delayed complications were discussed with the                            patient. Return to normal activities tomorrow.                            Written discharge instructions were provided to the                            patient.                           - High fiber diet.                           - Use FiberCon 1-2 tablets PO daily.                           - Continue present medications.                           - Repeat colonoscopy is not recommended due to                            current age for surveillance.                           - Would consider IBSrela or Trulance or Motegrity                            if issues persist (will need to see if we have                            any), for  now continue Amitiza .                           - Anorectal manometry will need to be considered in                            future if issues persist (Pelvic floor physical                            therapy x 1 date was not  helpful overall).                           - The findings and recommendations were discussed                            with the patient.                           - The findings and recommendations were discussed                            with the patient's family. Aloha Finner, MD 02/03/2024 11:19:00 AM

## 2024-02-04 ENCOUNTER — Telehealth: Payer: Self-pay | Admitting: *Deleted

## 2024-02-04 NOTE — Telephone Encounter (Signed)
  Follow up Call-     02/03/2024   10:18 AM  Call back number  Post procedure Call Back phone  # [phone]  Permission to leave phone message Yes     Patient questions:  Do you have a fever, pain , or abdominal swelling? No. Pain Score  0 *  Have you tolerated food without any problems? Yes.    Have you been able to return to your normal activities? Yes.    Do you have any questions about your discharge instructions: Diet   No. Medications  No. Follow up visit  No.  Do you have questions or concerns about your Care? No.  Actions: * If pain score is 4 or above: No action needed, pain <4.

## 2024-02-17 ENCOUNTER — Telehealth: Payer: Self-pay | Admitting: Gastroenterology

## 2024-02-17 ENCOUNTER — Telehealth: Payer: Self-pay

## 2024-02-17 ENCOUNTER — Other Ambulatory Visit (HOSPITAL_COMMUNITY): Payer: Self-pay

## 2024-02-17 MED ORDER — IBSRELA 50 MG PO TABS: 50.0000 mg | ORAL_TABLET | Freq: Two times a day (BID) | ORAL | 3 refills | Status: DC

## 2024-02-17 NOTE — Telephone Encounter (Signed)
 PA request has been Submitted. New Encounter has been or will be created for follow up. For additional info see Pharmacy Prior Auth telephone encounter from 02/17/2024.

## 2024-02-17 NOTE — Telephone Encounter (Signed)
 Prescription for Ibserla 50 mg has been sent to [facility].   RX Prior [employer] will require PA.

## 2024-02-17 NOTE — Telephone Encounter (Signed)
 Pharmacy Patient Advocate Encounter   Received notification from Pt Calls Messages that prior authorization for Ibsrela  50MG  tablets is required/requested.   Insurance verification completed.   The patient is insured through [facility].   Per test claim: PA required; PA submitted to above mentioned insurance via Latent Key/confirmation #/EOC [id number] Status is pending

## 2024-02-17 NOTE — Telephone Encounter (Signed)
 Inbound call from patient stating he was given samples of IBSrela  after his procedure on 9/2 and was told if it helped after 2 weeks to reach out so he can be sent a prescription, patient states its been helping and would like to know if he can be sent that prescription to pharmacy on file. Please advise  Thank you

## 2024-02-18 NOTE — Telephone Encounter (Signed)
 OK. We didn't have Trulance  samples to try so we had IBSrela  at the time. If we need to try Trulance , then give him 30-days of Trulance . He can update us  within a couple of weeks, if this is helpful and then his insurance should cover this. If not, then we can go back to his insurance and say let's get IBSrela . Thank you. GM

## 2024-02-18 NOTE — Telephone Encounter (Signed)
 Pharmacy Patient Advocate Encounter  Received notification from [facility] Medicare that Prior Authorization for Ibsrela  50MG  tablets has been DENIED.  Full denial letter will be uploaded to the media tab. See denial reason below.  Ibsrela  is denied because it is not on your plan's Drug List (formulary). Medication authorization requires the following:  (1) You need to try this covered drug: Trulance ; OR (2) your doctor needs to give us  specific medical reasons why the covered drug is not appropriate for you.  PA #/Case ID/Reference #: [id number]

## 2024-02-18 NOTE — Telephone Encounter (Signed)
 Will prescribe Trulance . Routing back to provider to get dosage and instructions.

## 2024-02-19 ENCOUNTER — Other Ambulatory Visit: Payer: Self-pay

## 2024-02-19 MED ORDER — TRULANCE 3 MG PO TABS: 1.0000 | ORAL_TABLET | Freq: Every day | ORAL | 1 refills | Status: DC

## 2024-02-19 NOTE — Telephone Encounter (Signed)
 Trulance  3 mg daily. 30/1. He should update us  if this is helpful for him. Thanks. GM

## 2024-02-19 NOTE — Telephone Encounter (Signed)
 Trulance  ordered. Called and spoke with pt. Discussed new medication and instructed pt to call back in about 2 weeks with update on how medication is working. Pt verbalized understanding.

## 2024-03-01 DIAGNOSIS — N320 Bladder-neck obstruction: Secondary | ICD-10-CM | POA: Diagnosis not present

## 2024-03-01 DIAGNOSIS — L988 Other specified disorders of the skin and subcutaneous tissue: Secondary | ICD-10-CM | POA: Diagnosis not present

## 2024-03-01 DIAGNOSIS — Z23 Encounter for immunization: Secondary | ICD-10-CM | POA: Diagnosis not present

## 2024-03-01 DIAGNOSIS — I10 Essential (primary) hypertension: Secondary | ICD-10-CM | POA: Diagnosis not present

## 2024-03-01 DIAGNOSIS — N1831 Chronic kidney disease, stage 3a: Secondary | ICD-10-CM | POA: Diagnosis not present

## 2024-03-01 DIAGNOSIS — Z5181 Encounter for therapeutic drug level monitoring: Secondary | ICD-10-CM | POA: Diagnosis not present

## 2024-03-01 DIAGNOSIS — D2221 Melanocytic nevi of right ear and external auricular canal: Secondary | ICD-10-CM | POA: Diagnosis not present

## 2024-03-01 DIAGNOSIS — M545 Low back pain, unspecified: Secondary | ICD-10-CM | POA: Diagnosis not present

## 2024-03-01 DIAGNOSIS — K5900 Constipation, unspecified: Secondary | ICD-10-CM | POA: Diagnosis not present

## 2024-03-01 DIAGNOSIS — D0321 Melanoma in situ of right ear and external auricular canal: Secondary | ICD-10-CM | POA: Diagnosis not present

## 2024-03-01 DIAGNOSIS — Z0000 Encounter for general adult medical examination without abnormal findings: Secondary | ICD-10-CM | POA: Diagnosis not present

## 2024-03-01 DIAGNOSIS — E039 Hypothyroidism, unspecified: Secondary | ICD-10-CM | POA: Diagnosis not present

## 2024-03-02 DIAGNOSIS — D0321 Melanoma in situ of right ear and external auricular canal: Secondary | ICD-10-CM | POA: Diagnosis not present

## 2024-03-04 ENCOUNTER — Telehealth: Payer: Self-pay | Admitting: Gastroenterology

## 2024-03-04 NOTE — Telephone Encounter (Signed)
 Received a call from patient stating the prescribed trulance  has been unnoticeable, patients he would continue taking to see if any solutions occur. Please review and advise  Thank you

## 2024-03-05 MED ORDER — PRUCALOPRIDE SUCCINATE 2 MG PO TABS: 2.0000 mg | ORAL_TABLET | Freq: Every day | ORAL | 1 refills | Status: DC

## 2024-03-05 NOTE — Telephone Encounter (Signed)
 Thank you for this update. Hopefully this will be affordable for the patient. GM

## 2024-03-05 NOTE — Addendum Note (Signed)
 Addended by: NICHOLAUS JARVIS on: 03/05/2024 02:44 PM   Modules accepted: Orders

## 2024-03-05 NOTE — Telephone Encounter (Signed)
 Attempted to reach pt. Spoke with spouse, pt is currently not available. She will have him return my call.

## 2024-03-05 NOTE — Telephone Encounter (Signed)
 If the patient has not noted a difference with Trulance , then is not clear we need to continue that. It seems like he is describing that it was not helpful so we should stop it. We could try to see if we can get Motegrity for him for chronic idiopathic constipation and see if that may be able to be covered. If so then may be that would be helpful. Otherwise, again we can obtain a prior authorization to try to get Ibsrela , but if it is financially not feasible then we will hold off on that per his request. Let me know if he would like to try Motegrity and if so then we can send in Motegrity 2 mg daily (30/1) and we can see if that is helpful for him. Thanks. GM

## 2024-03-05 NOTE — Telephone Encounter (Signed)
 Pt reports that he has been taking the Trulance  for about 2 weeks. He reports he has not noticed a difference with his bowel movements since starting this medication. Did discuss trying to obtain a PA for IBSrela  now since that was our next step. Pt states that it was quoted at over $170 per month and he does not want to pay that amount. Pt states he can continue with the Trulance , if Dr. Wilhelmenia is ok with that.

## 2024-03-05 NOTE — Telephone Encounter (Signed)
 Spoke with pt. Relayed message that pt can stop Trulance  if it is not helpful. Discussed Motegrity with pt. Pt states he is willing to give medication a try, depending on cost. Advised pt that he could discuss the cost with the pharmacy prior to picking up the medication. Pharmacy verified and medication ordered. Discussed possible need for a PA with pt, he verbalized understanding.

## 2024-03-24 DIAGNOSIS — M545 Low back pain, unspecified: Secondary | ICD-10-CM | POA: Diagnosis not present

## 2024-04-13 ENCOUNTER — Encounter: Payer: Self-pay | Admitting: Cardiovascular Disease

## 2024-04-13 ENCOUNTER — Ambulatory Visit: Attending: Cardiovascular Disease | Admitting: Cardiovascular Disease

## 2024-04-13 VITALS — BP 140/56 | HR 67 | Resp 17 | Ht 69.0 in | Wt 185.0 lb

## 2024-04-13 DIAGNOSIS — E782 Mixed hyperlipidemia: Secondary | ICD-10-CM | POA: Diagnosis not present

## 2024-04-13 DIAGNOSIS — G4733 Obstructive sleep apnea (adult) (pediatric): Secondary | ICD-10-CM

## 2024-04-13 DIAGNOSIS — I10 Essential (primary) hypertension: Secondary | ICD-10-CM | POA: Diagnosis not present

## 2024-04-13 NOTE — Assessment & Plan Note (Signed)
 History of obstructive sleep apnea past no longer requiring CPAP after significant weight loss.

## 2024-04-13 NOTE — Assessment & Plan Note (Signed)
 History of hyperlipidemia on statin therapy with lipid profile performed 03/01/2024 revealing total cholesterol of 179, LDL of 91 and HDL of 60.

## 2024-04-13 NOTE — Patient Instructions (Signed)
Medication Instructions:   No changes *If you need a refill on your cardiac medications before your next appointment, please call your pharmacy*   Lab Work: Not  needed    Testing/Procedures:  Not needed  Follow-Up: At [facility], you and your health needs are our priority.  As part of our continuing mission to provide you with exceptional heart care, we have created designated Provider Care Teams.  These Care Teams include your primary Cardiologist (physician) and Advanced Practice Providers (APPs -  Physician Assistants and Nurse Practitioners) who all work together to provide you with the care you need, when you need it.     Your next appointment:   12 month(s)  The format for your next appointment:   In Person  Provider:   Nanetta Batty, MD

## 2024-04-13 NOTE — Progress Notes (Addendum)
 04/13/2024 Randy Ford   1940/12/20  [id number]  Primary Physician Rexanne Ingle, MD Primary Cardiologist: Dorn JINNY Lesches MD [facility] [place], [place]  HPI:  Randy Ford is a 83 y.o.   mildly overweight married Caucasian male with no children. I last saw him in the office 04/14/2023.  He is accompanied by his wife Randy Ford today.  He has a history of hypertension, hyperlipidemia and obstructive sleep apnea on C Pap. He had a Myoview stress test performed on 06/24/2008 which was negative. His lipid profile followed by his primary care physician Dr. Rexanne during he took an excessive amount of oral iron pills one month ago in attempt to increase his hemoglobin prior to voluntary blood donation after which he developed substernal chest pain which has been constant. His primary care physician thought this could either be ischemia, reflux, or musculoskeletal. I performed a Myoview stress test on him 08/16/14 which was entirely remote normal as was a 2 week event monitor performed because of palpitations.    Since I saw him a year ago he continues to do well.  He denies chest pain or shortness of breath.  He still walks 3-4 times a week at a brisk pace without symptoms.   Current Meds  Medication Sig   amLODipine  (NORVASC ) 5 MG tablet TAKE ONE TABLET EACH DAY   docusate sodium  (COLACE) 50 MG capsule Take 100 mg by mouth daily.   imiquimod (ALDARA) 5 % cream Apply topically.   levothyroxine  (SYNTHROID , LEVOTHROID) 75 MCG tablet Take 75 mcg by mouth daily.     meloxicam (MOBIC) 15 MG tablet Take 15 mg by mouth daily.   mirtazapine (REMERON) 15 MG tablet Take 15 mg by mouth at bedtime as needed.    Omega-3 Fatty Acids (FISH OIL OMEGA-3 PO) Take 1 Capful by mouth in the morning and at bedtime.   polyethylene glycol (MIRALAX / GLYCOLAX) 17 g packet Take 17 g by mouth daily.    Potassium 99 MG TABS Take 1 tablet by mouth daily.   Prucalopride Succinate (MOTEGRITY) 2 MG TABS Take 1 tablet (2 mg  total) by mouth daily.   psyllium (METAMUCIL) 58.6 % packet Take 1 packet by mouth daily.   simvastatin  (ZOCOR ) 20 MG tablet TAKE ONE TABLET EACH DAY   sodium phosphate  Pediatric (FLEET) 3.5-9.5 GM/59ML enema Place 1 enema rectally at bedtime.   Turmeric 500 MG TABS Take by mouth.   Vitamin D, Cholecalciferol, 1000 UNITS TABS Take 1 tablet by mouth daily.     No Known Allergies  Social History   Socioeconomic History   Marital status: Married    Spouse name: Not on file   Number of children: 0   Years of education: Not on file   Highest education level: Not on file  Occupational History   Occupation: Retired    Comment: Runner, Broadcasting/film/video   Occupation: retired  Tobacco Use   Smoking status: Never   Smokeless tobacco: Never  Advertising Account Planner   Vaping status: Never Used  Substance and Sexual Activity   Alcohol use: Not Currently    Comment: 1/2 beer once a month   Drug use: Never   Sexual activity: Not Currently  Other Topics Concern   Not on file  Social History Narrative   Not on file   Social Drivers of Health   Financial Resource Strain: Not on file  Food Insecurity: Not on file  Transportation Needs: Not on file  Physical Activity: Not on file  Stress:  Not on file  Social Connections: Not on file  Intimate Partner Violence: Not on file     Review of Systems: General: negative for chills, fever, night sweats or weight changes.  Cardiovascular: negative for chest pain, dyspnea on exertion, edema, orthopnea, palpitations, paroxysmal nocturnal dyspnea or shortness of breath Dermatological: negative for rash Respiratory: negative for cough or wheezing Urologic: negative for hematuria Abdominal: negative for nausea, vomiting, diarrhea, bright red blood per rectum, melena, or hematemesis Neurologic: negative for visual changes, syncope, or dizziness All other systems reviewed and are otherwise negative except as noted above.    Blood pressure (!) 140/56, pulse 67, resp. rate  17, height 5' 9 (1.753 m), weight 185 lb (83.9 kg), SpO2 97%.  General appearance: alert and no distress Neck: no adenopathy, no carotid bruit, no JVD, supple, symmetrical, trachea midline, and thyroid  not enlarged, symmetric, no tenderness/mass/nodules Lungs: clear to auscultation bilaterally Heart: regular rate and rhythm, S1, S2 normal, no murmur, click, rub or gallop Extremities: extremities normal, atraumatic, no cyanosis or edema Pulses: 2+ and symmetric Skin: Skin color, texture, turgor normal. No rashes or lesions Neurologic: Grossly normal  EKG EKG Interpretation Date/Time:  Tuesday April 13 2024 13:17:39 EST Ventricular Rate:  69 PR Interval:  214 QRS Duration:  96 QT Interval:  374 QTC Calculation: 400 R Axis:   6  Text Interpretation: Sinus rhythm with 1st degree A-V block When compared with ECG of 14-Apr-2023 14:42, No significant change was found Confirmed by Court Carrier [id number]) on 04/13/2024 1:20:51 PM    ASSESSMENT AND PLAN:   Essential hypertension History of essential hypertension with blood pressure measured today at 153/62.  He normally runs much lower than his.  He is on amlodipine .  His blood pressure came down to 140/56 at the end of the visit.  Hyperlipidemia History of hyperlipidemia on statin therapy with lipid profile performed 03/01/2024 revealing total cholesterol of 179, LDL of 91 and HDL of 60.  Obstructive sleep apnea History of obstructive sleep apnea past no longer requiring CPAP after significant weight loss.     Carrier DOROTHA Court MD FACP,FACC,FAHA, [facility] 04/13/2024 1:34 PM

## 2024-04-13 NOTE — Assessment & Plan Note (Addendum)
 History of essential hypertension with blood pressure measured today at 153/62.  He normally runs much lower than his.  He is on amlodipine .  His blood pressure came down to 140/56 at the end of the visit.

## 2024-05-12 ENCOUNTER — Other Ambulatory Visit: Payer: Self-pay | Admitting: Gastroenterology

## 2024-05-19 ENCOUNTER — Telehealth: Payer: Self-pay | Admitting: Gastroenterology

## 2024-05-19 NOTE — Telephone Encounter (Signed)
 Spoke w pts pharmacy about Prucalopride Succinate  (MOTEGRITY ) 2 MG TABS not being covered by insurance starting 06/03/2024. Covered medications include Linzess ,Trulance , and Lubitrostone recommended by pharmacist.

## 2024-06-01 ENCOUNTER — Other Ambulatory Visit (HOSPITAL_COMMUNITY): Payer: Self-pay

## 2024-06-01 NOTE — Telephone Encounter (Signed)
 He has failed all 3 of those based on our chart. Can we work on prior authorization? Thanks. GM

## 2024-06-01 NOTE — Telephone Encounter (Signed)
 If patient formulary is changing on Jan 1, I will have to wait until then to process for a prior authorization. I will take note to do so

## 2024-06-07 ENCOUNTER — Telehealth: Payer: Self-pay

## 2024-06-07 ENCOUNTER — Other Ambulatory Visit (HOSPITAL_COMMUNITY): Payer: Self-pay

## 2024-06-07 NOTE — Telephone Encounter (Signed)
 Pharmacy Patient Advocate Encounter   Received notification from Physician's Office that prior authorization for Prucalopride Succinate  2MG  tablets is required/requested.   Insurance verification completed.   The patient is insured through [facility].   Per test claim: PA required; PA submitted to above mentioned insurance via Latent Key/confirmation #/EOC [facility] Status is pending

## 2024-06-14 MED ORDER — IBSRELA 50 MG PO TABS: 50.0000 mg | ORAL_TABLET | Freq: Two times a day (BID) | ORAL | 5 refills | Status: AC

## 2024-06-14 NOTE — Telephone Encounter (Signed)
 Randy Ford  has been sent to the pharmacy  The pt has been advised

## 2024-06-14 NOTE — Addendum Note (Signed)
 Addended by: ANITRA ODETTA CROME on: 06/14/2024 02:02 PM   Modules accepted: Orders

## 2024-06-14 NOTE — Telephone Encounter (Signed)
 Delon can you please review for Dr Wilhelmenia ( he is off all week)

## 2024-06-14 NOTE — Telephone Encounter (Signed)
 Pharmacy Patient Advocate Encounter  Received notification from [facility] Medicare that Prior Authorization for Prucalopride Succinate  2MG  tablets has been DENIED.  Full denial letter will be uploaded to the media tab. See denial reason below.  The following required information was not provided and/or clarified:  (1) If your diagnosis includes treatment of chronic idiopathic constipation  PA #/Case ID/Reference #: [id number]

## 2024-06-17 NOTE — Telephone Encounter (Signed)
 Agree with plan. GM

## 2024-06-18 ENCOUNTER — Other Ambulatory Visit (HOSPITAL_COMMUNITY): Payer: Self-pay

## 2024-06-28 ENCOUNTER — Telehealth: Payer: Self-pay

## 2024-06-28 NOTE — Telephone Encounter (Signed)
 Pharmacy Patient Advocate Encounter   Received notification from CoverMyMeds that prior authorization for Ibsrela  50MG  tablets is required/requested.   Insurance verification completed.   The patient is insured through [facility].   Per test claim: PA required; PA submitted to above mentioned insurance via Latent Key/confirmation #/EOC [id number] Status is pending

## 2024-07-01 ENCOUNTER — Other Ambulatory Visit (HOSPITAL_COMMUNITY): Payer: Self-pay

## 2024-07-01 NOTE — Telephone Encounter (Signed)
 Pharmacy Patient Advocate Encounter  Received notification from [facility] MEDICARE that Prior Authorization for Ibsrela  50MG  tablets has been APPROVED from 06-28-2024 to 06-02-2025. Ran test claim, Copay is $842.48**. This test claim was processed through [facility]- copay amounts may vary at other pharmacies due to pharmacy/plan contracts, or as the patient moves through the different stages of their insurance plan.   PA #/Case ID/Reference #: [id number]   **Patient is eligible for the Medicare Prescription Payment Plan Program. He can call his insurance for further information and to enroll.

## 2024-07-01 NOTE — Telephone Encounter (Signed)
 Noted pt. advised
